# Patient Record
Sex: Male | Born: 1965 | Hispanic: No | Smoking: Never smoker
Health system: Southern US, Community
[De-identification: ages and names within clinical notes are randomized; demographics above are authoritative.]

## PROBLEM LIST (undated history)

## (undated) DIAGNOSIS — E119 Type 2 diabetes mellitus without complications: Secondary | ICD-10-CM

## (undated) DIAGNOSIS — Z992 Dependence on renal dialysis: Secondary | ICD-10-CM

## (undated) DIAGNOSIS — I10 Essential (primary) hypertension: Secondary | ICD-10-CM

## (undated) DIAGNOSIS — T4145XA Adverse effect of unspecified anesthetic, initial encounter: Secondary | ICD-10-CM

## (undated) DIAGNOSIS — N186 End stage renal disease: Secondary | ICD-10-CM

## (undated) DIAGNOSIS — M109 Gout, unspecified: Secondary | ICD-10-CM

## (undated) DIAGNOSIS — E785 Hyperlipidemia, unspecified: Secondary | ICD-10-CM

## (undated) DIAGNOSIS — T8859XA Other complications of anesthesia, initial encounter: Secondary | ICD-10-CM

## (undated) DIAGNOSIS — N289 Disorder of kidney and ureter, unspecified: Secondary | ICD-10-CM

## (undated) DIAGNOSIS — N2581 Secondary hyperparathyroidism of renal origin: Secondary | ICD-10-CM

## (undated) DIAGNOSIS — D649 Anemia, unspecified: Secondary | ICD-10-CM

## (undated) HISTORY — PX: FISTULA PLUG: SHX5831

## (undated) HISTORY — DX: Anemia, unspecified: D64.9

## (undated) HISTORY — DX: Gout, unspecified: M10.9

## (undated) HISTORY — DX: Secondary hyperparathyroidism of renal origin: N25.81

## (undated) HISTORY — PX: NO PAST SURGERIES: SHX2092

---

## 2013-11-23 ENCOUNTER — Encounter: Payer: Self-pay | Admitting: Podiatry

## 2013-11-23 ENCOUNTER — Ambulatory Visit (INDEPENDENT_AMBULATORY_CARE_PROVIDER_SITE_OTHER): Payer: BC Managed Care – PPO | Admitting: Podiatry

## 2013-11-23 VITALS — BP 140/95 | HR 63 | Ht 74.0 in | Wt 177.0 lb

## 2013-11-23 DIAGNOSIS — Q828 Other specified congenital malformations of skin: Secondary | ICD-10-CM

## 2013-11-23 DIAGNOSIS — E1149 Type 2 diabetes mellitus with other diabetic neurological complication: Secondary | ICD-10-CM

## 2013-11-23 DIAGNOSIS — E1142 Type 2 diabetes mellitus with diabetic polyneuropathy: Secondary | ICD-10-CM

## 2013-11-23 DIAGNOSIS — M722 Plantar fascial fibromatosis: Secondary | ICD-10-CM | POA: Insufficient documentation

## 2013-11-23 DIAGNOSIS — L84 Corns and callosities: Secondary | ICD-10-CM

## 2013-11-23 DIAGNOSIS — E1140 Type 2 diabetes mellitus with diabetic neuropathy, unspecified: Secondary | ICD-10-CM

## 2013-11-23 NOTE — Progress Notes (Addendum)
Subjective: 48 year old NIDDM presents with pain and burning on both feet.  Stated that his blood sugar stay at 150 level before he takes morning pills.   Objective: Venous stasis discoloration dermatitis bilateral ankles without open skin. Pedal pulses all palpable.  Decreased tactile sensations, decreased sensory response to Monofilament testing both feet. Multiple hammer toes bilateral. High arched foot with loss of plantar fat pad.  Plantar calluses under 2-4 MPJ bilateral. Left heel pain with loss of fat pad.  Assessment: NIDDM uncontrolled. Diabetic neuropathy. Plantar callus bilateral. Heel pain left secondary to loss of plantar fat pad.    Plan: 1. Discussed diabetic foot care and importance of keeping blood sugar under control. Patient will go have nutritional consultation for diabetic diet.  2.  Debrided all calluses bilateral. Patient was encouraged to return to have calluses trimmed in regular basis. 3. Reviewed Neuremedy and PDC Restorer for Neuropathic pain bilateral. 4.  Discussed benefit from orthotic shoe inserts. Patient will return to have orthotics made.

## 2013-11-23 NOTE — Patient Instructions (Signed)
Seen for pain on both feet.  Debrided all calluses bilateral. Reviewed Neuremedy and PDC Restorer for Neuropathic pain bilateral. May benefit from orthotic shoe inserts. Will contact insurance co for benefits.

## 2013-12-13 ENCOUNTER — Ambulatory Visit (INDEPENDENT_AMBULATORY_CARE_PROVIDER_SITE_OTHER): Payer: BC Managed Care – PPO | Admitting: Podiatry

## 2013-12-13 ENCOUNTER — Encounter: Payer: Self-pay | Admitting: Podiatry

## 2013-12-13 VITALS — BP 168/98 | HR 64 | Ht 74.0 in | Wt 177.0 lb

## 2013-12-13 DIAGNOSIS — E1140 Type 2 diabetes mellitus with diabetic neuropathy, unspecified: Secondary | ICD-10-CM

## 2013-12-13 DIAGNOSIS — E1142 Type 2 diabetes mellitus with diabetic polyneuropathy: Secondary | ICD-10-CM

## 2013-12-13 DIAGNOSIS — M722 Plantar fascial fibromatosis: Secondary | ICD-10-CM

## 2013-12-13 DIAGNOSIS — E1149 Type 2 diabetes mellitus with other diabetic neurological complication: Secondary | ICD-10-CM

## 2013-12-13 DIAGNOSIS — Q828 Other specified congenital malformations of skin: Secondary | ICD-10-CM

## 2013-12-13 DIAGNOSIS — M79609 Pain in unspecified limb: Secondary | ICD-10-CM

## 2013-12-13 NOTE — Progress Notes (Signed)
Patient presents to have orthotics prepared.  Both feet casted for Orthotics. X-rays taken and noted of long 2nd Metatarsal bone with high arched foot.  No acute changes in osseous or articular surfaces noted. Reviewed findings with patient.

## 2013-12-13 NOTE — Patient Instructions (Signed)
Both feet casted for orthotics and X-rays taken.

## 2014-01-21 ENCOUNTER — Ambulatory Visit: Payer: BC Managed Care – PPO | Admitting: Podiatry

## 2014-01-21 ENCOUNTER — Ambulatory Visit (INDEPENDENT_AMBULATORY_CARE_PROVIDER_SITE_OTHER): Payer: BC Managed Care – PPO | Admitting: Podiatry

## 2014-01-21 ENCOUNTER — Encounter: Payer: Self-pay | Admitting: Podiatry

## 2014-01-21 VITALS — BP 161/104 | HR 75

## 2014-01-21 DIAGNOSIS — M722 Plantar fascial fibromatosis: Secondary | ICD-10-CM

## 2014-01-21 DIAGNOSIS — E1149 Type 2 diabetes mellitus with other diabetic neurological complication: Secondary | ICD-10-CM

## 2014-01-21 DIAGNOSIS — M79609 Pain in unspecified limb: Secondary | ICD-10-CM

## 2014-01-21 DIAGNOSIS — Q828 Other specified congenital malformations of skin: Secondary | ICD-10-CM

## 2014-01-21 DIAGNOSIS — M79606 Pain in leg, unspecified: Secondary | ICD-10-CM

## 2014-01-21 DIAGNOSIS — E1140 Type 2 diabetes mellitus with diabetic neuropathy, unspecified: Secondary | ICD-10-CM

## 2014-01-21 DIAGNOSIS — E1142 Type 2 diabetes mellitus with diabetic polyneuropathy: Secondary | ICD-10-CM

## 2014-01-21 NOTE — Progress Notes (Signed)
Seen for one month follow up. Orthotics are doing well.  Takes a few minutes to get used to and then the feet are feeling fine.  Objective: Thick recurrent callus plantar surface both feet. High arched foot with loss of fat pad on plantar surfaces including heel.  Multiple brown colored skin in patches with poor integrity both lower limbs. Loss of normal tactile sensation on both feet.  Assessment: Diabetic neuropathy with painful calluses bilateral.  Plan: Reviewed findings. All calluses debrided. Return in 2 month to repeat debridement.

## 2014-01-21 NOTE — Patient Instructions (Signed)
Doing well with Orthotics. All calluses trimmed today. Return in 2 months or sooner if needed.

## 2014-05-19 ENCOUNTER — Inpatient Hospital Stay (HOSPITAL_BASED_OUTPATIENT_CLINIC_OR_DEPARTMENT_OTHER)
Admission: EM | Admit: 2014-05-19 | Discharge: 2014-05-21 | DRG: 683 | Disposition: A | Discharge status: Home / Self Care | Payer: BC Managed Care – PPO | Attending: Internal Medicine | Admitting: Internal Medicine

## 2014-05-19 ENCOUNTER — Emergency Department (HOSPITAL_BASED_OUTPATIENT_CLINIC_OR_DEPARTMENT_OTHER): Payer: BC Managed Care – PPO

## 2014-05-19 ENCOUNTER — Encounter (HOSPITAL_BASED_OUTPATIENT_CLINIC_OR_DEPARTMENT_OTHER): Payer: Self-pay | Admitting: Emergency Medicine

## 2014-05-19 DIAGNOSIS — N19 Unspecified kidney failure: Secondary | ICD-10-CM

## 2014-05-19 DIAGNOSIS — Q612 Polycystic kidney, adult type: Secondary | ICD-10-CM

## 2014-05-19 DIAGNOSIS — M722 Plantar fascial fibromatosis: Secondary | ICD-10-CM

## 2014-05-19 DIAGNOSIS — N179 Acute kidney failure, unspecified: Principal | ICD-10-CM

## 2014-05-19 DIAGNOSIS — E1169 Type 2 diabetes mellitus with other specified complication: Secondary | ICD-10-CM | POA: Diagnosis present

## 2014-05-19 DIAGNOSIS — E872 Acidosis, unspecified: Secondary | ICD-10-CM | POA: Diagnosis present

## 2014-05-19 DIAGNOSIS — E785 Hyperlipidemia, unspecified: Secondary | ICD-10-CM | POA: Diagnosis present

## 2014-05-19 DIAGNOSIS — Q828 Other specified congenital malformations of skin: Secondary | ICD-10-CM

## 2014-05-19 DIAGNOSIS — I10 Essential (primary) hypertension: Secondary | ICD-10-CM

## 2014-05-19 DIAGNOSIS — D509 Iron deficiency anemia, unspecified: Secondary | ICD-10-CM | POA: Diagnosis present

## 2014-05-19 DIAGNOSIS — L84 Corns and callosities: Secondary | ICD-10-CM

## 2014-05-19 DIAGNOSIS — Z79899 Other long term (current) drug therapy: Secondary | ICD-10-CM

## 2014-05-19 DIAGNOSIS — D649 Anemia, unspecified: Secondary | ICD-10-CM | POA: Diagnosis present

## 2014-05-19 DIAGNOSIS — M109 Gout, unspecified: Secondary | ICD-10-CM

## 2014-05-19 DIAGNOSIS — Z7982 Long term (current) use of aspirin: Secondary | ICD-10-CM

## 2014-05-19 DIAGNOSIS — R600 Localized edema: Secondary | ICD-10-CM

## 2014-05-19 DIAGNOSIS — Z880 Allergy status to penicillin: Secondary | ICD-10-CM

## 2014-05-19 DIAGNOSIS — E119 Type 2 diabetes mellitus without complications: Secondary | ICD-10-CM

## 2014-05-19 DIAGNOSIS — G5790 Unspecified mononeuropathy of unspecified lower limb: Secondary | ICD-10-CM | POA: Diagnosis present

## 2014-05-19 DIAGNOSIS — E875 Hyperkalemia: Secondary | ICD-10-CM | POA: Diagnosis present

## 2014-05-19 DIAGNOSIS — Z841 Family history of disorders of kidney and ureter: Secondary | ICD-10-CM

## 2014-05-19 DIAGNOSIS — E1122 Type 2 diabetes mellitus with diabetic chronic kidney disease: Secondary | ICD-10-CM

## 2014-05-19 DIAGNOSIS — D6489 Other specified anemias: Secondary | ICD-10-CM

## 2014-05-19 DIAGNOSIS — E1140 Type 2 diabetes mellitus with diabetic neuropathy, unspecified: Secondary | ICD-10-CM | POA: Diagnosis present

## 2014-05-19 HISTORY — DX: Type 2 diabetes mellitus without complications: E11.9

## 2014-05-19 HISTORY — DX: Disorder of kidney and ureter, unspecified: N28.9

## 2014-05-19 HISTORY — DX: Essential (primary) hypertension: I10

## 2014-05-19 HISTORY — DX: Hyperlipidemia, unspecified: E78.5

## 2014-05-19 LAB — URINALYSIS, ROUTINE W REFLEX MICROSCOPIC
BILIRUBIN URINE: NEGATIVE
GLUCOSE, UA: NEGATIVE mg/dL
KETONES UR: NEGATIVE mg/dL
Leukocytes, UA: NEGATIVE
Nitrite: NEGATIVE
PH: 6 (ref 5.0–8.0)
PROTEIN: 100 mg/dL — AB
Specific Gravity, Urine: 1.01 (ref 1.005–1.030)
Urobilinogen, UA: 0.2 mg/dL (ref 0.0–1.0)

## 2014-05-19 LAB — CBC
HCT: 35.9 % — ABNORMAL LOW (ref 39.0–52.0)
Hemoglobin: 12 g/dL — ABNORMAL LOW (ref 13.0–17.0)
MCH: 25.2 pg — ABNORMAL LOW (ref 26.0–34.0)
MCHC: 33.4 g/dL (ref 30.0–36.0)
MCV: 75.3 fL — AB (ref 78.0–100.0)
PLATELETS: 187 10*3/uL (ref 150–400)
RBC: 4.77 MIL/uL (ref 4.22–5.81)
RDW: 13.5 % (ref 11.5–15.5)
WBC: 9.6 10*3/uL (ref 4.0–10.5)

## 2014-05-19 LAB — COMPREHENSIVE METABOLIC PANEL
ALBUMIN: 4.4 g/dL (ref 3.5–5.2)
ALK PHOS: 117 U/L (ref 39–117)
ALT: 16 U/L (ref 0–53)
AST: 15 U/L (ref 0–37)
Anion gap: 18 — ABNORMAL HIGH (ref 5–15)
BILIRUBIN TOTAL: 0.3 mg/dL (ref 0.3–1.2)
BUN: 57 mg/dL — ABNORMAL HIGH (ref 6–23)
CO2: 20 meq/L (ref 19–32)
Calcium: 9.6 mg/dL (ref 8.4–10.5)
Chloride: 102 mEq/L (ref 96–112)
Creatinine, Ser: 4.9 mg/dL — ABNORMAL HIGH (ref 0.50–1.35)
GFR calc Af Amer: 15 mL/min — ABNORMAL LOW (ref >90)
GFR, EST NON AFRICAN AMERICAN: 13 mL/min — AB (ref >90)
Glucose, Bld: 100 mg/dL — ABNORMAL HIGH (ref 70–99)
POTASSIUM: 4.5 meq/L (ref 3.7–5.3)
Sodium: 140 mEq/L (ref 137–147)
Total Protein: 8.8 g/dL — ABNORMAL HIGH (ref 6.0–8.3)

## 2014-05-19 LAB — URINE MICROSCOPIC-ADD ON

## 2014-05-19 NOTE — ED Provider Notes (Addendum)
CSN: [id number]     Arrival date & time 05/19/14  1904 History   First MD Initiated Contact with Patient 05/19/14 1948     Chief Complaint  Patient presents with  . Foot Pain     (Consider location/radiation/quality/duration/timing/severity/associated sxs/prior Treatment) HPI Comments: Pt states that he has had swelling of his left foot for the last couple of weeks and in the last week he has noticed it is his right foot and up his shin in the left leg. Pt denies cp, sob. States that he has also developed redness to the top of the left foot in the last week. States that he came in because he thought more labs needs to be drawn  The history is provided by the patient. No language interpreter was used.    Past Medical History  Diagnosis Date  . Diabetes mellitus without complication   . Hypertension   . Renal disorder    History reviewed. No pertinent past surgical history. No family history on file. History  Substance Use Topics  . Smoking status: Never Smoker   . Smokeless tobacco: Never Used  . Alcohol Use: No    Review of Systems  Constitutional: Negative.   Respiratory: Negative for cough and shortness of breath.   Cardiovascular: Negative for chest pain.  Gastrointestinal: Negative.       Allergies  Penicillins  Home Medications   Prior to Admission medications   Medication Sig Start Date End Date Taking? Authorizing Provider  aspirin 81 MG tablet Take 81 mg by mouth daily.   Yes Historical Provider, MD  atorvastatin (LIPITOR) 40 MG tablet Take 40 mg by mouth daily.   Yes Historical Provider, MD  carvedilol (COREG) 6.25 MG tablet Take 6.25 mg by mouth 2 (two) times daily with a meal.   Yes Historical Provider, MD  nateglinide (STARLIX) 120 MG tablet Take 120 mg by mouth 3 (three) times daily with meals.   Yes Historical Provider, MD  amLODipine (NORVASC) 5 MG tablet Take 10 mg by mouth daily.  11/15/13   Historical Provider, MD  glipiZIDE (GLUCOTROL) 5 MG tablet  Take 5 mg by mouth daily before breakfast.  11/15/13   Historical Provider, MD  isosorbide-hydrALAZINE (BIDIL) 20-37.5 MG per tablet Take 1 tablet by mouth 3 (three) times daily.    Historical Provider, MD  simvastatin (ZOCOR) 20 MG tablet Take 20 mg by mouth daily.  11/15/13   Historical Provider, MD   BP 164/101  Pulse 75  Temp(Src) 97.8 F (36.6 C) (Oral)  Resp 18  Ht 6\' 2"  (1.88 m)  Wt 177 lb (80.287 kg)  BMI 22.72 kg/m2  SpO2 99% Physical Exam  Nursing note and vitals reviewed. Constitutional: He is oriented to person, place, and time. He appears well-developed and well-nourished.  Cardiovascular: Normal rate and regular rhythm.   Pulmonary/Chest: Effort normal and breath sounds normal.  Musculoskeletal: He exhibits edema.  Pitting edema to left shin and foot. And to right foot  Neurological: He is alert and oriented to person, place, and time.  Skin:  Mild redness noted to the top of the left foot. Pt has full rom. Pulses intact    ED Course  Procedures (including critical care time) Labs Review Labs Reviewed  CBC - Abnormal; Notable for the following:    Hemoglobin 12.0 (*)    HCT 35.9 (*)    MCV 75.3 (*)    MCH 25.2 (*)    All other components within normal limits  COMPREHENSIVE METABOLIC  PANEL - Abnormal; Notable for the following:    Glucose, Bld 100 (*)    BUN 57 (*)    Creatinine, Ser 4.90 (*)    Total Protein 8.8 (*)    GFR calc non Af Amer 13 (*)    GFR calc Af Amer 15 (*)    Anion gap 18 (*)    All other components within normal limits  URINALYSIS, ROUTINE W REFLEX MICROSCOPIC - Abnormal; Notable for the following:    Hgb urine dipstick TRACE (*)    Protein, ur 100 (*)    All other components within normal limits  URINE MICROSCOPIC-ADD ON    Imaging Review Dg Chest 2 View  05/19/2014   CLINICAL DATA:  Swelling in left foot for 2 weeks.  EXAM: CHEST  2 VIEW  COMPARISON:  None.  FINDINGS: The heart size and mediastinal contours are within normal  limits. Both lungs are clear. The visualized skeletal structures are unremarkable.  IMPRESSION: No active cardiopulmonary disease.   Electronically Signed   By: Kathreen Devoid   On: 05/19/2014 20:54   Dg Foot Complete Left  05/19/2014   CLINICAL DATA:  Foot swelling  EXAM: LEFT FOOT - COMPLETE 3+ VIEW  COMPARISON:  None.  FINDINGS: There is no evidence of fracture or dislocation. There is no evidence of arthropathy or other focal bone abnormality. Dorsal soft tissue swelling noted. Small plantar heel spur noted.  IMPRESSION: 1. Dorsal soft tissue swelling. 2. No acute bone abnormalities.   Electronically Signed   By: Kerby Moors M.D.   On: 05/19/2014 20:40     EKG Interpretation None      MDM   Final diagnoses:  Renal failure    Pt to be admitted to [facility] for renal failure, will lower extremity edema. Considered a dvt in the left leg ultrasound not available at this time and think unlikely. Pt has no pulmonary edema. Area on redness to foot noted. Don't think antibiotics are need to be started at this time    Glendell Docker, NP 05/19/14 2132  Glendell Docker, NP 05/23/14 [phone]

## 2014-05-19 NOTE — ED Provider Notes (Addendum)
Medical screening examination/treatment/procedure(s) were conducted as a shared visit with non-physician practitioner(s) and myself.  I personally evaluated the patient during the encounter.   EKG Interpretation None      47 yo male with moderate, gradual onset, waxing and waning edema to bilateral lower extremities, worse on left.  On exam, well appearing, nontoxic, not distressed, normal respiratory effort, normal perfusion, 1+ pitting edema LLE, tract pitting edema RLE, 2+ B DP pulses.  Labs show renal insufficiency.  Plan admit.   Clinical Impression: 1. Renal failure   2. Bilateral edema of lower extremity       Houston Siren III, MD 05/19/14 2321  Arbie Cookey, MD 05/19/14 [phone]

## 2014-05-19 NOTE — Progress Notes (Signed)
  Pt admitted to the unit. Pt is stable, alert and oriented per baseline. Oriented to room, staff, and call bell. Educated to call for any assistance. Bed in lowest position, call bell within reach- will continue to monitor. 

## 2014-05-19 NOTE — ED Notes (Signed)
Pt report given to [place], Therapist, sports with [facility].

## 2014-05-19 NOTE — ED Notes (Signed)
Report given to RN Ariealle.

## 2014-05-19 NOTE — ED Notes (Signed)
Pain and swelling to his left ankle on and off x 1 week.

## 2014-05-20 ENCOUNTER — Encounter (HOSPITAL_COMMUNITY): Payer: Self-pay | Admitting: Internal Medicine

## 2014-05-20 ENCOUNTER — Inpatient Hospital Stay (HOSPITAL_COMMUNITY): Payer: BC Managed Care – PPO

## 2014-05-20 DIAGNOSIS — Q612 Polycystic kidney, adult type: Secondary | ICD-10-CM

## 2014-05-20 DIAGNOSIS — N19 Unspecified kidney failure: Secondary | ICD-10-CM

## 2014-05-20 DIAGNOSIS — N179 Acute kidney failure, unspecified: Secondary | ICD-10-CM | POA: Diagnosis present

## 2014-05-20 DIAGNOSIS — I10 Essential (primary) hypertension: Secondary | ICD-10-CM

## 2014-05-20 DIAGNOSIS — R600 Localized edema: Secondary | ICD-10-CM

## 2014-05-20 DIAGNOSIS — D649 Anemia, unspecified: Secondary | ICD-10-CM | POA: Diagnosis present

## 2014-05-20 DIAGNOSIS — M109 Gout, unspecified: Secondary | ICD-10-CM | POA: Diagnosis present

## 2014-05-20 DIAGNOSIS — E119 Type 2 diabetes mellitus without complications: Secondary | ICD-10-CM | POA: Diagnosis present

## 2014-05-20 DIAGNOSIS — R609 Edema, unspecified: Secondary | ICD-10-CM

## 2014-05-20 DIAGNOSIS — D6489 Other specified anemias: Secondary | ICD-10-CM

## 2014-05-20 LAB — CBC WITH DIFFERENTIAL/PLATELET
BASOS PCT: 0 % (ref 0–1)
Basophils Absolute: 0 10*3/uL (ref 0.0–0.1)
EOS PCT: 6 % — AB (ref 0–5)
Eosinophils Absolute: 0.5 10*3/uL (ref 0.0–0.7)
HEMATOCRIT: 32.9 % — AB (ref 39.0–52.0)
HEMOGLOBIN: 10.8 g/dL — AB (ref 13.0–17.0)
LYMPHS ABS: 1.8 10*3/uL (ref 0.7–4.0)
Lymphocytes Relative: 22 % (ref 12–46)
MCH: 24.4 pg — ABNORMAL LOW (ref 26.0–34.0)
MCHC: 32.8 g/dL (ref 30.0–36.0)
MCV: 74.3 fL — AB (ref 78.0–100.0)
MONOS PCT: 9 % (ref 3–12)
Monocytes Absolute: 0.8 10*3/uL (ref 0.1–1.0)
Neutro Abs: 5.3 10*3/uL (ref 1.7–7.7)
Neutrophils Relative %: 63 % (ref 43–77)
PLATELETS: 183 10*3/uL (ref 150–400)
RBC: 4.43 MIL/uL (ref 4.22–5.81)
RDW: 13.3 % (ref 11.5–15.5)
WBC: 8.4 10*3/uL (ref 4.0–10.5)

## 2014-05-20 LAB — RETICULOCYTES
RBC.: 4.43 MIL/uL (ref 4.22–5.81)
RETIC COUNT ABSOLUTE: 48.7 10*3/uL (ref 19.0–186.0)
Retic Ct Pct: 1.1 % (ref 0.4–3.1)

## 2014-05-20 LAB — GLUCOSE, CAPILLARY
Glucose-Capillary: 183 mg/dL — ABNORMAL HIGH (ref 70–99)
Glucose-Capillary: 165 mg/dL — ABNORMAL HIGH (ref 70–99)
Glucose-Capillary: 353 mg/dL — ABNORMAL HIGH (ref 70–99)

## 2014-05-20 LAB — CK: Total CK: 190 U/L (ref 7–232)

## 2014-05-20 LAB — PROTEIN / CREATININE RATIO, URINE
Creatinine, Urine: 58.35 mg/dL
Protein Creatinine Ratio: 0.89 — ABNORMAL HIGH (ref 0.00–0.15)
Total Protein, Urine: 51.8 mg/dL

## 2014-05-20 LAB — URIC ACID: Uric Acid, Serum: 8.7 mg/dL — ABNORMAL HIGH (ref 4.0–7.8)

## 2014-05-20 LAB — COMPREHENSIVE METABOLIC PANEL
ALBUMIN: 3.7 g/dL (ref 3.5–5.2)
ALT: 14 U/L (ref 0–53)
AST: 11 U/L (ref 0–37)
Alkaline Phosphatase: 101 U/L (ref 39–117)
Anion gap: 17 — ABNORMAL HIGH (ref 5–15)
BUN: 54 mg/dL — ABNORMAL HIGH (ref 6–23)
CALCIUM: 8.9 mg/dL (ref 8.4–10.5)
CO2: 19 mEq/L (ref 19–32)
Chloride: 104 mEq/L (ref 96–112)
Creatinine, Ser: 4.71 mg/dL — ABNORMAL HIGH (ref 0.50–1.35)
GFR calc Af Amer: 15 mL/min — ABNORMAL LOW (ref >90)
GFR, EST NON AFRICAN AMERICAN: 13 mL/min — AB (ref >90)
Glucose, Bld: 114 mg/dL — ABNORMAL HIGH (ref 70–99)
Potassium: 4.3 mEq/L (ref 3.7–5.3)
Sodium: 140 mEq/L (ref 137–147)
Total Bilirubin: 0.2 mg/dL — ABNORMAL LOW (ref 0.3–1.2)
Total Protein: 7.5 g/dL (ref 6.0–8.3)

## 2014-05-20 LAB — VITAMIN B12: Vitamin B-12: 479 pg/mL (ref 211–911)

## 2014-05-20 LAB — FERRITIN: FERRITIN: 88 ng/mL (ref 22–322)

## 2014-05-20 LAB — TSH: TSH: 7.05 u[IU]/mL — ABNORMAL HIGH (ref 0.350–4.500)

## 2014-05-20 LAB — IRON AND TIBC
Iron: 45 ug/dL (ref 42–135)
Saturation Ratios: 16 % — ABNORMAL LOW (ref 20–55)
TIBC: 275 ug/dL (ref 215–435)
UIBC: 230 ug/dL (ref 125–400)

## 2014-05-20 LAB — FOLATE: Folate: 11.8 ng/mL

## 2014-05-20 LAB — SODIUM, URINE, RANDOM: Sodium, Ur: 77 mEq/L

## 2014-05-20 LAB — SEDIMENTATION RATE: SED RATE: 46 mm/h — AB (ref 0–16)

## 2014-05-20 LAB — PHOSPHORUS: PHOSPHORUS: 5.1 mg/dL — AB (ref 2.3–4.6)

## 2014-05-20 LAB — CREATININE, URINE, RANDOM: Creatinine, Urine: 55.37 mg/dL

## 2014-05-20 MED ORDER — ATORVASTATIN CALCIUM 40 MG PO TABS
40.0000 mg | ORAL_TABLET | Freq: Every day | ORAL | Status: DC
  Administered 2014-05-20: 40 mg via ORAL
  Filled 2014-05-20 (×2): qty 1

## 2014-05-20 MED ORDER — HEPARIN SODIUM (PORCINE) 5000 UNIT/ML IJ SOLN
5000.0000 [IU] | Freq: Three times a day (TID) | INTRAMUSCULAR | Status: DC
  Filled 2014-05-20 (×7): qty 1

## 2014-05-20 MED ORDER — INSULIN ASPART 100 UNIT/ML ~~LOC~~ SOLN
5.0000 [IU] | Freq: Once | SUBCUTANEOUS | Status: AC
  Administered 2014-05-20: 5 [IU] via SUBCUTANEOUS

## 2014-05-20 MED ORDER — SODIUM CHLORIDE 0.9 % IV SOLN
INTRAVENOUS | Status: AC
  Administered 2014-05-20: 02:00:00 via INTRAVENOUS

## 2014-05-20 MED ORDER — ATORVASTATIN CALCIUM 40 MG PO TABS
40.0000 mg | ORAL_TABLET | Freq: Every day | ORAL | Status: DC
  Filled 2014-05-20: qty 1

## 2014-05-20 MED ORDER — AMLODIPINE BESYLATE 10 MG PO TABS
10.0000 mg | ORAL_TABLET | Freq: Every day | ORAL | Status: DC
  Administered 2014-05-20 – 2014-05-21 (×2): 10 mg via ORAL
  Filled 2014-05-20 (×2): qty 1

## 2014-05-20 MED ORDER — ONDANSETRON HCL 4 MG PO TABS: 4.0000 mg | ORAL_TABLET | Freq: Four times a day (QID) | ORAL | Status: DC | PRN

## 2014-05-20 MED ORDER — ACETAMINOPHEN 650 MG RE SUPP: 650.0000 mg | Freq: Four times a day (QID) | RECTAL | Status: DC | PRN

## 2014-05-20 MED ORDER — CARVEDILOL 6.25 MG PO TABS
6.2500 mg | ORAL_TABLET | Freq: Two times a day (BID) | ORAL | Status: DC
  Administered 2014-05-20 – 2014-05-21 (×3): 6.25 mg via ORAL
  Filled 2014-05-20 (×5): qty 1

## 2014-05-20 MED ORDER — PREDNISONE 20 MG PO TABS
40.0000 mg | ORAL_TABLET | Freq: Every day | ORAL | Status: DC
  Administered 2014-05-20: 40 mg via ORAL
  Filled 2014-05-20 (×4): qty 2

## 2014-05-20 MED ORDER — ONDANSETRON HCL 4 MG/2ML IJ SOLN: 4.0000 mg | Freq: Four times a day (QID) | INTRAMUSCULAR | Status: DC | PRN

## 2014-05-20 MED ORDER — INSULIN ASPART 100 UNIT/ML ~~LOC~~ SOLN
0.0000 [IU] | Freq: Three times a day (TID) | SUBCUTANEOUS | Status: DC
  Administered 2014-05-20: 1 [IU] via SUBCUTANEOUS
  Administered 2014-05-20 (×2): 2 [IU] via SUBCUTANEOUS

## 2014-05-20 MED ORDER — ACETAMINOPHEN 325 MG PO TABS: 650.0000 mg | ORAL_TABLET | Freq: Four times a day (QID) | ORAL | Status: DC | PRN

## 2014-05-20 NOTE — ED Provider Notes (Signed)
Medical screening examination/treatment/procedure(s) were conducted as a shared visit with non-physician practitioner(s) and myself.  I personally evaluated the patient during the encounter.   EKG Interpretation None        Houston Siren III, MD 05/20/14 [phone]

## 2014-05-20 NOTE — H&P (Signed)
[facility] History and Physical  Donald Alvarez [id number] DOB: 08-13-1966 DOA: 05/19/2014  Referring physician: ER physician. Patient was transferred from [facility]. PCP: [name], MD   Chief Complaint: Lower extremity edema and renal failure.  HPI: Donald Alvarez is a 48 y.o. male with history of hypertension diabetes mellitus hyperlipidemia experiencing increasing pain in the left ankle with swelling over the last one week. Patient states that he was diagnosed with renal failure 3 weeks ago and was referred to nephrologist Dr. Clover Mealy. Patient does not recall his creatinine exactly but states it may be around 3. In the ER was found to be around 4.9. On exam patient was having mild edema of the lower extremities more on the left side. In addition patient was having pain on moving his left ankle but not his right ankle. Denies any fever chills nausea vomiting abdominal pain diarrhea chest pain or shortness of breath or any productive cough. Patient is not on any ACE inhibitors or ARB or diuretics. Patient states he has taken Motrin 3 days ago for low back pain which is chronic. He states he takes NSAIDs very rarely and at the most once a month. UA shows mild protein but no definite casts. Patient has been admit for further management. On exam patient has mild tenderness of the left ankle.   Review of Systems: As presented in the history of presenting illness, rest negative.  Past Medical History  Diagnosis Date  . Diabetes mellitus without complication   . Hypertension   . Renal disorder   . HLD (hyperlipidemia)    Past Surgical History  Procedure Laterality Date  . No past surgeries     Social History:  reports that he has never smoked. He has never used smokeless tobacco. He reports that he does not drink alcohol or use illicit drugs. Where does patient live home. Can patient participate in ADLs? Yes.  Allergies  Allergen Reactions  . Penicillins      Family History:  Family History  Problem Relation Age of Onset  . Diabetes Mellitus II Mother   . Kidney disease Mother   . CAD Father       Prior to Admission medications   Medication Sig Start Date End Date Taking? Authorizing Provider  aspirin 81 MG tablet Take 81 mg by mouth daily.   Yes Historical Provider, MD  atorvastatin (LIPITOR) 40 MG tablet Take 40 mg by mouth daily.   Yes Historical Provider, MD  carvedilol (COREG) 6.25 MG tablet Take 6.25 mg by mouth 2 (two) times daily with a meal.   Yes Historical Provider, MD  nateglinide (STARLIX) 120 MG tablet Take 120 mg by mouth 3 (three) times daily with meals.   Yes Historical Provider, MD  amLODipine (NORVASC) 5 MG tablet Take 10 mg by mouth daily.  11/15/13   Historical Provider, MD  glipiZIDE (GLUCOTROL) 5 MG tablet Take 5 mg by mouth daily before breakfast.  11/15/13   Historical Provider, MD  isosorbide-hydrALAZINE (BIDIL) 20-37.5 MG per tablet Take 1 tablet by mouth 3 (three) times daily.    Historical Provider, MD  simvastatin (ZOCOR) 20 MG tablet Take 20 mg by mouth daily.  11/15/13   Historical Provider, MD    Physical Exam: Filed Vitals:   05/19/14 1915 05/19/14 2232 05/19/14 2337  BP: 164/101 160/92 167/88  Pulse: 75 75 76  Temp: 97.8 F (36.6 C) 97.7 F (36.5 C) 97.9 F (36.6 C)  TempSrc: Oral Oral Oral  Resp: 18 16 16  Height: 6\' 2"  (1.88 m)  6\' 2"  (1.88 m)  Weight: 80.287 kg (177 lb)  84.369 kg (186 lb)  SpO2: 99% 99% 98%     General:  Well-developed well-nourished.  Eyes: Anicteric no pallor.  ENT: No discharge from the ears eyes nose mouth.  Neck: No mass felt. JVD not appreciated.  Cardiovascular: S1-S2 heard.  Respiratory: No rhonchi or crepitations.  Abdomen: Soft nontender bowel sounds present. No guarding or rigidity.  Skin: No rash.  Musculoskeletal: Mild tenderness in the left ankle with bilateral edema of the lower extremities more on the left side. The left lower extremity edema  extends up to the calf.  Psychiatric: Appears normal.  Neurologic: Alert awake oriented to time place and person. Moves all extremities.  Labs on Admission:  Basic Metabolic Panel:  Recent Labs Lab 05/19/14 2004  NA 140  K 4.5  CL 102  CO2 20  GLUCOSE 100*  BUN 57*  CREATININE 4.90*  CALCIUM 9.6   Liver Function Tests:  Recent Labs Lab 05/19/14 2004  AST 15  ALT 16  ALKPHOS 117  BILITOT 0.3  PROT 8.8*  ALBUMIN 4.4   No results found for this basename: LIPASE, AMYLASE,  in the last 168 hours No results found for this basename: AMMONIA,  in the last 168 hours CBC:  Recent Labs Lab 05/19/14 2004  WBC 9.6  HGB 12.0*  HCT 35.9*  MCV 75.3*  PLT 187   Cardiac Enzymes: No results found for this basename: CKTOTAL, CKMB, CKMBINDEX, TROPONINI,  in the last 168 hours  BNP (last 3 results) No results found for this basename: PROBNP,  in the last 8760 hours CBG: No results found for this basename: GLUCAP,  in the last 168 hours  Radiological Exams on Admission: Dg Chest 2 View  05/19/2014   CLINICAL DATA:  Swelling in left foot for 2 weeks.  EXAM: CHEST  2 VIEW  COMPARISON:  None.  FINDINGS: The heart size and mediastinal contours are within normal limits. Both lungs are clear. The visualized skeletal structures are unremarkable.  IMPRESSION: No active cardiopulmonary disease.   Electronically Signed   By: Kathreen Devoid   On: 05/19/2014 20:54   Dg Foot Complete Left  05/19/2014   CLINICAL DATA:  Foot swelling  EXAM: LEFT FOOT - COMPLETE 3+ VIEW  COMPARISON:  None.  FINDINGS: There is no evidence of fracture or dislocation. There is no evidence of arthropathy or other focal bone abnormality. Dorsal soft tissue swelling noted. Small plantar heel spur noted.  IMPRESSION: 1. Dorsal soft tissue swelling. 2. No acute bone abnormalities.   Electronically Signed   By: Kerby Moors M.D.   On: 05/19/2014 20:40    Assessment/Plan Principal Problem:   Renal failure Active  Problems:   Bilateral edema of lower extremity   HTN (hypertension)   Diabetes mellitus   Anemia   Acute renal failure   1. Renal failure probably acute on chronic - I have ordered renal sonogram, urine sodium creatinine and protein creatinine ratio. Check SPEP and UPEP. For now and gently hydrating patient(patient does have mild edema in the lower extremity but it is also tender on the left side). Closely follow intake output and metabolic panel and daily weight and consult nephrologist in a.m. 2. Lower extremity edema with tenderness in the left ankle - check Dopplers to rule out DVT and check sedimentation rate and uric acid levels. 3. Anemia - I have advised patient eventually will need colonoscopy. Check anemia  panel. Check SPEP. 4. Hypertension - continue home medications. 5. Diabetes mellitus - holding off oral antihyperglycemic for now. Place patient on sliding-scale coverage. Check hemoglobin A1c. 6. Hyperlipidemia - check CK levels. On statins.    Code Status: Full code.  Family Communication: None.  Disposition Plan: Admit to inpatient.    Tabbitha Janvrin N. [facility] Pager [phone].  If 7PM-7AM, please contact night-coverage [email or web address] Password [email or web address] 05/20/2014, 1:41 AM

## 2014-05-20 NOTE — Consult Note (Signed)
Reason for Consult:AKI on CKD Referring Physician: [name], MD   HPI: Donald Alvarez is a 48 y.o. male with a PMH significant for HTN (difficult to control), DMII (diagnosed ~4 yrs ago, unknown HA1c), dyslipidemia who p/w increasing pain and swelling in the left leg over the past week which is new.  He describes the pain as "burning" without any erythema or warmth.  No history of gout.  He was told he had "problems with his kidneys" over a year ago by his PCP.  Last visit with his PCP Donald Alvarez, [place]) was a month ago with a Cr of 3 with referral to Dr. [name] but was unable to get an appointment for two months.  Denies any fever/chills, CP, SOB, N/V, abdominal pain, urinary symptoms (no hematuria, dysuria, hesitancy), no hematochezia, melena.  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  No recent long-distance travel.  Pt not on any ACEi/ARB.  Took 451m ibuprofen last week for pain in his foot.  Denies regular use of NSAIDS.  No recent abx, IV contrast.  No new medications, except for neclinide started two months ago.  He has been eating and drinking normally.  No h/o tobacco abuse, recreational drug use, no regular alcohol use.  Family history is significant for his mother ((73yo) who had a renal transplant 3 yrs ago for "cysts on top of her kidney."  She was briefly on dialysis while awaiting transplant.  She is going well and lives in [place]  Has two brothers and is unsure if they have any kidney issues.  No known hepatic issues.       PMH:   Past Medical History  Diagnosis Date  . Diabetes mellitus without complication   . Hypertension   . Renal disorder   . HLD (hyperlipidemia)     PSH:   Past Surgical History  Procedure Laterality Date  . No past surgeries      Allergies:  Allergies  Allergen Reactions  . Penicillins Hives    Medications:   Prior to Admission medications   Medication Sig Start Date  End Date Taking? Authorizing Provider  aspirin 81 MG tablet Take 81 mg by mouth daily.   Yes Historical Provider, MD  atorvastatin (LIPITOR) 40 MG tablet Take 40 mg by mouth daily.   Yes Historical Provider, MD  carvedilol (COREG) 6.25 MG tablet Take 6.25 mg by mouth 2 (two) times daily with a meal.   Yes Historical Provider, MD  Cholecalciferol (VITAMIN D3) 3000 UNITS TABS Take 1 tablet by mouth daily.   Yes Historical Provider, MD  insulin glargine (LANTUS) 100 UNIT/ML injection Inject 10 Units into the skin daily as needed (for high blood sugar).   Yes Historical Provider, MD  nateglinide (STARLIX) 120 MG tablet Take 120 mg by mouth 3 (three) times daily with meals.   Yes Historical Provider, MD  amLODipine (NORVASC) 5 MG tablet Take 10 mg by mouth daily.  11/15/13   Historical Provider, MD    Discontinued Meds:   Medications Discontinued During This Encounter  Medication Reason  . glipiZIDE (GLUCOTROL) 5 MG tablet Discontinued by provider  . isosorbide-hydrALAZINE (BIDIL) 20-37.5 MG per tablet Side effect (s)  . simvastatin (ZOCOR) 20 MG tablet Change in therapy    Social History:  reports that he has never smoked. He has never used smokeless tobacco. He reports that he does not drink alcohol or use illicit drugs.  Family History:  Family History  Problem Relation Age of Onset  . Diabetes Mellitus II Mother   . Kidney disease Mother   . CAD Father    Review of systems: Noted in HPI.   Creatinine, Ser  Date/Time Value Ref Range Status  05/20/2014  3:47 AM 4.71* 0.50 - 1.35 mg/dL Final  05/19/2014  8:04 PM 4.90* 0.50 - 1.35 mg/dL Final    Recent Labs Lab 05/19/14 2004 05/20/14 0347  NA 140 140  K 4.5 4.3  CL 102 104  CO2 20 19  GLUCOSE 100* 114*  BUN 57* 54*  CREATININE 4.90* 4.71*  CALCIUM 9.6 8.9  PHOS  --  5.1*    Recent Labs Lab 05/19/14 2004 05/20/14 0347  WBC 9.6 8.4  NEUTROABS  --  5.3  HGB 12.0* 10.8*  HCT 35.9* 32.9*  MCV 75.3* 74.3*  PLT 187 183    Liver Function Tests:  Recent Labs Lab 05/19/14 2004 05/20/14 0347  AST 15 11  ALT 16 14  ALKPHOS 117 101  BILITOT 0.3 0.2*  PROT 8.8* 7.5  ALBUMIN 4.4 3.7   No results found for this basename: LIPASE, AMYLASE,  in the last 168 hours No results found for this basename: AMMONIA,  in the last 168 hours Cardiac Enzymes:  Recent Labs Lab 05/20/14 0347  CKTOTAL 190   Iron Studies:   Recent Labs  05/20/14 0347  IRON 45  TIBC 275  FERRITIN 88    Results for orders placed during the hospital encounter of 05/19/14 (from the past 48 hour(s))  CBC     Status: Abnormal   Collection Time    05/19/14  8:04 PM      Result Value Ref Range   WBC 9.6  4.0 - 10.5 K/uL   RBC 4.77  4.22 - 5.81 MIL/uL   Hemoglobin 12.0 (*) 13.0 - 17.0 g/dL   HCT 35.9 (*) 39.0 - 52.0 %   MCV 75.3 (*) 78.0 - 100.0 fL   MCH 25.2 (*) 26.0 - 34.0 pg   MCHC 33.4  30.0 - 36.0 g/dL   RDW 13.5  11.5 - 15.5 %   Platelets 187  150 - 400 K/uL  COMPREHENSIVE METABOLIC PANEL     Status: Abnormal   Collection Time    05/19/14  8:04 PM      Result Value Ref Range   Sodium 140  137 - 147 mEq/L   Potassium 4.5  3.7 - 5.3 mEq/L   Chloride 102  96 - 112 mEq/L   CO2 20  19 - 32 mEq/L   Glucose, Bld 100 (*) 70 - 99 mg/dL   BUN 57 (*) 6 - 23 mg/dL   Creatinine, Ser 4.90 (*) 0.50 - 1.35 mg/dL   Calcium 9.6  8.4 - 10.5 mg/dL   Total Protein 8.8 (*) 6.0 - 8.3 g/dL   Albumin 4.4  3.5 - 5.2 g/dL   AST 15  0 - 37 U/L   ALT 16  0 - 53 U/L   Alkaline Phosphatase 117  39 - 117 U/L   Total Bilirubin 0.3  0.3 - 1.2 mg/dL   GFR calc non Af Amer 13 (*) >90 mL/min   GFR calc Af Amer 15 (*) >90 mL/min   Comment: (NOTE)     The eGFR has been calculated using the CKD EPI equation.     This calculation has not been validated in all clinical situations.     eGFR's persistently <90 mL/min signify possible Chronic  Kidney     Disease.   Anion gap 18 (*) 5 - 15  URINALYSIS, ROUTINE W REFLEX MICROSCOPIC     Status: Abnormal    Collection Time    05/19/14  8:08 PM      Result Value Ref Range   Color, Urine YELLOW  YELLOW   APPearance CLEAR  CLEAR   Specific Gravity, Urine 1.010  1.005 - 1.030   pH 6.0  5.0 - 8.0   Glucose, UA NEGATIVE  NEGATIVE mg/dL   Hgb urine dipstick TRACE (*) NEGATIVE   Bilirubin Urine NEGATIVE  NEGATIVE   Ketones, ur NEGATIVE  NEGATIVE mg/dL   Protein, ur 100 (*) NEGATIVE mg/dL   Urobilinogen, UA 0.2  0.0 - 1.0 mg/dL   Nitrite NEGATIVE  NEGATIVE   Leukocytes, UA NEGATIVE  NEGATIVE  URINE MICROSCOPIC-ADD ON     Status: None   Collection Time    05/19/14  8:08 PM      Result Value Ref Range   Squamous Epithelial / LPF RARE  RARE   RBC / HPF 0-2  <3 RBC/hpf   Bacteria, UA RARE  RARE  SODIUM, URINE, RANDOM     Status: None   Collection Time    05/20/14  2:35 AM      Result Value Ref Range   Sodium, Ur 77    CREATININE, URINE, RANDOM     Status: None   Collection Time    05/20/14  2:35 AM      Result Value Ref Range   Creatinine, Urine 55.37    PROTEIN / CREATININE RATIO, URINE     Status: Abnormal   Collection Time    05/20/14  2:35 AM      Result Value Ref Range   Creatinine, Urine 58.35     Total Protein, Urine 51.8     Comment: NO NORMAL RANGE ESTABLISHED FOR THIS TEST   PROTEIN CREATININE RATIO 0.89 (*) 0.00 - 0.15  VITAMIN B12     Status: None   Collection Time    05/20/14  3:47 AM      Result Value Ref Range   Vitamin B-12 479  211 - 911 pg/mL   Comment: Performed at [facility]     Status: None   Collection Time    05/20/14  3:47 AM      Result Value Ref Range   Folate 11.8     Comment: (NOTE)     Reference Ranges            Deficient:       0.4 - 3.3 ng/mL            Indeterminate:   3.4 - 5.4 ng/mL            Normal:              > 5.4 ng/mL     Performed at [facility] TIBC     Status: Abnormal   Collection Time    05/20/14  3:47 AM      Result Value Ref Range   Iron 45  42 - 135 ug/dL   TIBC 275  215 - 435  ug/dL   Saturation Ratios 16 (*) 20 - 55 %   UIBC 230  125 - 400 ug/dL   Comment: Performed at [place] [facility]     Status: None   Collection Time    05/20/14  3:47 AM  Result Value Ref Range   Ferritin 88  22 - 322 ng/mL   Comment: Performed at [facility]     Status: None   Collection Time    05/20/14  3:47 AM      Result Value Ref Range   Retic Ct Pct 1.1  0.4 - 3.1 %   RBC. 4.43  4.22 - 5.81 MIL/uL   Retic Count, Manual 48.7  19.0 - 186.0 K/uL  COMPREHENSIVE METABOLIC PANEL     Status: Abnormal   Collection Time    05/20/14  3:47 AM      Result Value Ref Range   Sodium 140  137 - 147 mEq/L   Potassium 4.3  3.7 - 5.3 mEq/L   Chloride 104  96 - 112 mEq/L   CO2 19  19 - 32 mEq/L   Glucose, Bld 114 (*) 70 - 99 mg/dL   BUN 54 (*) 6 - 23 mg/dL   Creatinine, Ser 4.71 (*) 0.50 - 1.35 mg/dL   Calcium 8.9  8.4 - 10.5 mg/dL   Total Protein 7.5  6.0 - 8.3 g/dL   Albumin 3.7  3.5 - 5.2 g/dL   AST 11  0 - 37 U/L   ALT 14  0 - 53 U/L   Alkaline Phosphatase 101  39 - 117 U/L   Total Bilirubin 0.2 (*) 0.3 - 1.2 mg/dL   GFR calc non Af Amer 13 (*) >90 mL/min   GFR calc Af Amer 15 (*) >90 mL/min   Comment: (NOTE)     The eGFR has been calculated using the CKD EPI equation.     This calculation has not been validated in all clinical situations.     eGFR's persistently <90 mL/min signify possible Chronic Kidney     Disease.   Anion gap 17 (*) 5 - 15  CBC WITH DIFFERENTIAL     Status: Abnormal   Collection Time    05/20/14  3:47 AM      Result Value Ref Range   WBC 8.4  4.0 - 10.5 K/uL   RBC 4.43  4.22 - 5.81 MIL/uL   Hemoglobin 10.8 (*) 13.0 - 17.0 g/dL   HCT 32.9 (*) 39.0 - 52.0 %   MCV 74.3 (*) 78.0 - 100.0 fL   MCH 24.4 (*) 26.0 - 34.0 pg   MCHC 32.8  30.0 - 36.0 g/dL   RDW 13.3  11.5 - 15.5 %   Platelets 183  150 - 400 K/uL   Neutrophils Relative % 63  43 - 77 %   Lymphocytes Relative 22  12 - 46 %   Monocytes Relative 9  3 - 12 %    Eosinophils Relative 6 (*) 0 - 5 %   Basophils Relative 0  0 - 1 %   Neutro Abs 5.3  1.7 - 7.7 K/uL   Lymphs Abs 1.8  0.7 - 4.0 K/uL   Monocytes Absolute 0.8  0.1 - 1.0 K/uL   Eosinophils Absolute 0.5  0.0 - 0.7 K/uL   Basophils Absolute 0.0  0.0 - 0.1 K/uL   Smear Review MORPHOLOGY UNREMARKABLE    TSH     Status: Abnormal   Collection Time    05/20/14  3:47 AM      Result Value Ref Range   TSH 7.050 (*) 0.350 - 4.500 uIU/mL  URIC ACID     Status: Abnormal   Collection Time    05/20/14  3:47 AM  Result Value Ref Range   Uric Acid, Serum 8.7 (*) 4.0 - 7.8 mg/dL  SEDIMENTATION RATE     Status: Abnormal   Collection Time    05/20/14  3:47 AM      Result Value Ref Range   Sed Rate 46 (*) 0 - 16 mm/hr  PHOSPHORUS     Status: Abnormal   Collection Time    05/20/14  3:47 AM      Result Value Ref Range   Phosphorus 5.1 (*) 2.3 - 4.6 mg/dL  CK     Status: None   Collection Time    05/20/14  3:47 AM      Result Value Ref Range   Total CK 190  7 - 232 U/L  GLUCOSE, CAPILLARY     Status: Abnormal   Collection Time    05/20/14 12:06 PM      Result Value Ref Range   Glucose-Capillary 183 (*) 70 - 99 mg/dL    Dg Chest 2 View  05/19/2014   CLINICAL DATA:  Swelling in left foot for 2 weeks.  EXAM: CHEST  2 VIEW  COMPARISON:  None.  FINDINGS: The heart size and mediastinal contours are within normal limits. Both lungs are clear. The visualized skeletal structures are unremarkable.  IMPRESSION: No active cardiopulmonary disease.   Electronically Signed   By: Kathreen Devoid   On: 05/19/2014 20:54   US Renal  05/20/2014   CLINICAL DATA:  Elevated creatinine  EXAM: RENAL/URINARY TRACT ULTRASOUND COMPLETE  COMPARISON:  None.  FINDINGS: Right Kidney:  Length: 16.2 cm. Multiple cysts of varying sizes are noted throughout the right kidney consistent with polyp cystic kidney disease. Mild fullness of the renal pelvis is noted.  Left Kidney:  Length: 16 cm. Multiple cysts of varying sizes are  noted throughout the left kidney. Mild pelvocaliectasis is noted.  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  Mild fullness of the renal pelvis bilaterally. This does not extend into the proximal ureters.   Electronically Signed   By: Inez Catalina M.D.   On: 05/20/2014 07:23   Dg Foot Complete Left  05/19/2014   CLINICAL DATA:  Foot swelling  EXAM: LEFT FOOT - COMPLETE 3+ VIEW  COMPARISON:  None.  FINDINGS: There is no evidence of fracture or dislocation. There is no evidence of arthropathy or other focal bone abnormality. Dorsal soft tissue swelling noted. Small plantar heel spur noted.  IMPRESSION: 1. Dorsal soft tissue swelling. 2. No acute bone abnormalities.   Electronically Signed   By: Kerby Moors M.D.   On: 05/19/2014 20:40    Vital signs: Blood pressure 127/80, pulse 73, temperature 98 F (36.7 C), temperature source Oral, resp. rate 16, height _0  (1.88 m), weight 186 lb (84.369 kg), SpO2 98.00%.  Physical Exam: Constitutional: Vital signs reviewed.  Patient is a well-developed and well-nourished male in no acute distress and cooperative with exam.  Head: Normocephalic and atraumatic Eyes: PERRL, EOMI, conjunctivae normal, no scleral icterus.  Neck: Supple, Trachea midline .  Cardiovascular: RRR, no MRG, pulses symmetric and intact bilaterally Pulmonary/Chest: normal respiratory effort, CTAB, no wheezes, rales, or rhonchi Abdominal: Soft. Non-tender, non-distended, bowel sounds are normal, no masses, organomegaly, or guarding present.  Musculoskeletal: No joint deformities, erythema, or stiffness Neurological: A&O x3, cranial nerve II-XII are grossly intact, no focal motor deficit Skin: Warm, dry and intact.  Extremities: bilateral LE trace-1+ pitting edema L>R,  chronic venous stasis changes Psychiatric: Normal mood and affect.    Assessment/Plan:  1. AKI on CKD Stage 4- Pt p/w elevated Cr 4.90 which was  ~3 one month ago.  FeNa: 4.7% and Bun/Cr ratio<15 which is not suggestive of prerenal.  Has been eating and drinking well.  CK: 190, not likely rhabdomyolysis.  SPEP pending, ESR elevated but nonspecific, uric acid elevated nonspecific, urine protein/cr ratio:0.89.  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 Mild fullness of the renal pelvis bilaterally. This does not extend into the proximal ureters.  Most likely etiology PKD with the strong FH and renal US suggestive in the setting of likely uncontrolled HTN and DM.  Unfortunately, there is not much we can offer except would advise strict BP control <130/80, HA1c <7, and continued statins for dyslipidemia (pt follows with Dr. Einar Gip). Would consider testing for potential donors for PKD and also an abdominal US to r/o hepatic cysts in the future.    2. Microcytic anemia- hgb 12.0 7/2-->10.8 today; perhaps dilutional given abrupt decline with any s/s of bleeding; possibly related to CKD but would expect more normocytic; anemia panel unrevealing, FOBT?  Pt has not had colonoscopy which should follow-up as an outpatient.   3.  DMII- last HA1c unknown, continue current regimen   4.  Dyslipidemia- continue statin  5.  Lower extremity pain/edema- XR of left foot shows no acute bone abnormalities, but soft tissue swelling, doubt gout given no warmth or erythema, likely peripheral neuropathy and recommend gabapentin; pt states he had an echo done recently by Dr. Einar Gip and told he has "mild leakage" of his valve    Jones Bales, MD PGY-2, Internal Medicine Teaching Service 05/20/2014, 3:03 PM   I have seen and examined this patient and agree with plan as outlined by Dr. Gordy Levan.  Mr. 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  His AKI/CKD may be related to NSAID use but appears to be improving slightly with IVF's.  We discussed the severity and chronicity of his disease as  well as the natural history of ADPKD.  We discussed the ways to delay the progression of CKD including:  Tight BP control with goal <130/80, Tight glucose control with goal HgbA1c <7%, use of an ACE/ARB, and avoidance of nephrotoxic agents such as NSAIDs/COX-II I's/IV contrast.  Given his AKI/CKD would not add ACE/ARB at this time.  We also discussed RRT including but not limited to In-center Hemodialysis, home hemodialysis, peritoneal dialysis and renal transplantation.    His left foot/leg pain may be related to gout or pseudogout and would recommend Ortho eval for fine needle aspiration and microscopic examination of synovial fluid to further evaluate.  Would also consider empiric treatment with colchicine 0.40m daily.  Agree with duplex to r/o DVT but more consistent with an inflammatory arthritic process and/or neuropathic pain. Adithya Difrancesco A,MD 05/20/2014 4:12 PM

## 2014-05-20 NOTE — Progress Notes (Signed)
PATIENT DETAILS Name: Donald Alvarez Age: 48 y.o. Sex: male Date of Birth: 08-30-1966 Admit Date: 05/19/2014 Admitting Physician [name], MD [id number], MD  Subjective: Still with left lower ext pain/swelling  Assessment/Plan: Principal Problem:  Acute on Chronic Renal failure stage 4 -suspect worsening polycystic kidney disease. No evidence of prerenal or post renal etiology at this point -Nephrology to evaluate  Active Problems: Left Leg pain/swelling -uric acid is significantly elevated, suspect gout-start empiric prednisone -await Doppler to rule out DVT  DM -monitor CBG's -c/w SSI  HTN -c/w Coreg  Dyslipidemia -c/w Statins  Anemia -suspect secondary to CKD -await Anemia panel  Disposition: Remain inpatient  DVT Prophylaxis: Prophylactic  Heparin   Code Status: Full code  Family Communication None at bedside  Procedures:  None  CONSULTS:  neurology  Time spent 40 minutes-which includes 50% of the time with face-to-face with patient/ family and coordinating care related to the above assessment and plan.    MEDICATIONS: Scheduled Meds: . amLODipine  10 mg Oral Daily  . atorvastatin  40 mg Oral Daily  . carvedilol  6.25 mg Oral BID WC  . heparin  5,000 Units Subcutaneous 3 times per day  . insulin aspart  0-9 Units Subcutaneous TID WC  . predniSONE  40 mg Oral Q breakfast   Continuous Infusions: . sodium chloride 75 mL/hr at 05/20/14 0222   PRN Meds:.acetaminophen, acetaminophen, ondansetron (ZOFRAN) IV, ondansetron  Antibiotics: Anti-infectives   None       PHYSICAL EXAM: Vital signs in last 24 hours: Filed Vitals:   05/19/14 2232 05/19/14 2337 05/20/14 0206 05/20/14 0500  BP: 160/92 167/88  148/74  Pulse: 75 76  75  Temp: 97.7 F (36.5 C) 97.9 F (36.6 C)  97.7 F (36.5 C)  TempSrc: Oral Oral  Oral  Resp: 16 16  16   Height:  6\' 2"  (1.88 m)    Weight:  84.369 kg (186 lb) 84.369 kg (186 lb)  84.369 kg (186 lb)  SpO2: 99% 98%  98%    Weight change:  Filed Weights   05/19/14 2337 05/20/14 0206 05/20/14 0500  Weight: 84.369 kg (186 lb) 84.369 kg (186 lb) 84.369 kg (186 lb)   Body mass index is 23.87 kg/(m^2).   Gen Exam: Awake and alert with clear speech.   Neck: Supple, No JVD.   Chest: B/L Clear.   CVS: S1 S2 Regular, no murmurs.  Abdomen: soft, BS +, non tender, non distended.  Extremities: no edema, left leg-dorsum-tender, mild;y erythematous and mild edema Neurologic: Non Focal.   Skin: No Rash.   Wounds: N/A.    Intake/Output from previous day: No intake or output data in the 24 hours ending 05/20/14 1132   LAB RESULTS: CBC  Recent Labs Lab 05/19/14 2004 05/20/14 0347  WBC 9.6 8.4  HGB 12.0* 10.8*  HCT 35.9* 32.9*  PLT 187 183  MCV 75.3* 74.3*  MCH 25.2* 24.4*  MCHC 33.4 32.8  RDW 13.5 13.3  LYMPHSABS  --  1.8  MONOABS  --  0.8  EOSABS  --  0.5  BASOSABS  --  0.0    Chemistries   Recent Labs Lab 05/19/14 2004 05/20/14 0347  NA 140 140  K 4.5 4.3  CL 102 104  CO2 20 19  GLUCOSE 100* 114*  BUN 57* 54*  CREATININE 4.90* 4.71*  CALCIUM 9.6 8.9    CBG: No results found for this basename: GLUCAP,  in the last  168 hours  GFR Estimated Creatinine Clearance: 21.8 ml/min (by C-G formula based on Cr of 4.71).  Coagulation profile No results found for this basename: INR, PROTIME,  in the last 168 hours  Cardiac Enzymes No results found for this basename: CK, CKMB, TROPONINI, MYOGLOBIN,  in the last 168 hours  No components found with this basename: POCBNP,  No results found for this basename: DDIMER,  in the last 72 hours No results found for this basename: HGBA1C,  in the last 72 hours No results found for this basename: CHOL, HDL, LDLCALC, TRIG, CHOLHDL, LDLDIRECT,  in the last 72 hours  Recent Labs  05/20/14 0347  TSH 7.050*    Recent Labs  05/20/14 0347  RETICCTPCT 1.1   No results found for this basename: LIPASE,  AMYLASE,  in the last 72 hours  Urine Studies No results found for this basename: UACOL, UAPR, USPG, UPH, UTP, UGL, UKET, UBIL, UHGB, UNIT, UROB, ULEU, UEPI, UWBC, URBC, UBAC, CAST, CRYS, UCOM, BILUA,  in the last 72 hours  MICROBIOLOGY: No results found for this or any previous visit (from the past 240 hour(s)).  RADIOLOGY STUDIES/RESULTS: Dg Chest 2 View  05/19/2014   CLINICAL DATA:  Swelling in left foot for 2 weeks.  EXAM: CHEST  2 VIEW  COMPARISON:  None.  FINDINGS: The heart size and mediastinal contours are within normal limits. Both lungs are clear. The visualized skeletal structures are unremarkable.  IMPRESSION: No active cardiopulmonary disease.   Electronically Signed   By: Kathreen Devoid   On: 05/19/2014 20:54   US Renal  05/20/2014   CLINICAL DATA:  Elevated creatinine  EXAM: RENAL/URINARY TRACT ULTRASOUND COMPLETE  COMPARISON:  None.  FINDINGS: Right Kidney:  Length: 16.2 cm. Multiple cysts of varying sizes are noted throughout the right kidney consistent with polyp cystic kidney disease. Mild fullness of the renal pelvis is noted.  Left Kidney:  Length: 16 cm. Multiple cysts of varying sizes are noted throughout the left kidney. Mild pelvocaliectasis is noted.  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  Mild fullness of the renal pelvis bilaterally. This does not extend into the proximal ureters.   Electronically Signed   By: Inez Catalina M.D.   On: 05/20/2014 07:23   Dg Foot Complete Left  05/19/2014   CLINICAL DATA:  Foot swelling  EXAM: LEFT FOOT - COMPLETE 3+ VIEW  COMPARISON:  None.  FINDINGS: There is no evidence of fracture or dislocation. There is no evidence of arthropathy or other focal bone abnormality. Dorsal soft tissue swelling noted. Small plantar heel spur noted.  IMPRESSION: 1. Dorsal soft tissue swelling. 2. No acute bone abnormalities.   Electronically Signed   By: Kerby Moors M.D.   On:  05/19/2014 Mannie Stabile, MD  [facility] Pager:[phone]  If 7PM-7AM, please contact night-coverage [email or web address] Password [email or web address] 05/20/2014, 11:32 AM   LOS: 1 day   **Disclaimer: This note may have been dictated with voice recognition software. Similar sounding words can inadvertently be transcribed and this note may contain transcription errors which may not have been corrected upon publication of note.**

## 2014-05-20 NOTE — Progress Notes (Signed)
Utilization review completed.  

## 2014-05-21 DIAGNOSIS — M109 Gout, unspecified: Secondary | ICD-10-CM

## 2014-05-21 DIAGNOSIS — N179 Acute kidney failure, unspecified: Principal | ICD-10-CM

## 2014-05-21 LAB — GLUCOSE, CAPILLARY
GLUCOSE-CAPILLARY: 170 mg/dL — AB (ref 70–99)
Glucose-Capillary: 269 mg/dL — ABNORMAL HIGH (ref 70–99)
Glucose-Capillary: 342 mg/dL — ABNORMAL HIGH (ref 70–99)

## 2014-05-21 LAB — RENAL FUNCTION PANEL
ALBUMIN: 3.5 g/dL (ref 3.5–5.2)
ANION GAP: 19 — AB (ref 5–15)
BUN: 61 mg/dL — AB (ref 6–23)
CO2: 15 mEq/L — ABNORMAL LOW (ref 19–32)
Calcium: 9 mg/dL (ref 8.4–10.5)
Chloride: 100 mEq/L (ref 96–112)
Creatinine, Ser: 4.77 mg/dL — ABNORMAL HIGH (ref 0.50–1.35)
GFR calc non Af Amer: 13 mL/min — ABNORMAL LOW (ref >90)
GFR, EST AFRICAN AMERICAN: 15 mL/min — AB (ref >90)
Glucose, Bld: 242 mg/dL — ABNORMAL HIGH (ref 70–99)
Phosphorus: 3 mg/dL (ref 2.3–4.6)
Potassium: 5.4 mEq/L — ABNORMAL HIGH (ref 3.7–5.3)
Sodium: 134 mEq/L — ABNORMAL LOW (ref 137–147)

## 2014-05-21 MED ORDER — INSULIN GLARGINE 100 UNIT/ML ~~LOC~~ SOLN
15.0000 [IU] | Freq: Every day | SUBCUTANEOUS | Status: DC
  Administered 2014-05-21: 15 [IU] via SUBCUTANEOUS
  Filled 2014-05-21: qty 0.15

## 2014-05-21 MED ORDER — SODIUM POLYSTYRENE SULFONATE 15 GM/60ML PO SUSP
30.0000 g | Freq: Once | ORAL | Status: AC
  Administered 2014-05-21: 30 g via ORAL
  Filled 2014-05-21: qty 120

## 2014-05-21 MED ORDER — PREDNISONE 20 MG PO TABS
30.0000 mg | ORAL_TABLET | Freq: Every day | ORAL | Status: DC
  Administered 2014-05-21: 30 mg via ORAL
  Filled 2014-05-21 (×2): qty 1

## 2014-05-21 MED ORDER — INSULIN GLARGINE 100 UNIT/ML ~~LOC~~ SOLN: 12.0000 [IU] | Freq: Every day | SUBCUTANEOUS | Status: DC

## 2014-05-21 MED ORDER — COLCHICINE 0.6 MG PO TABS: 0.6000 mg | ORAL_TABLET | Freq: Every day | ORAL | Status: DC

## 2014-05-21 MED ORDER — INSULIN ASPART 100 UNIT/ML ~~LOC~~ SOLN
0.0000 [IU] | Freq: Three times a day (TID) | SUBCUTANEOUS | Status: DC
Start: 2014-05-21 — End: 2014-05-21
  Administered 2014-05-21: 3 [IU] via SUBCUTANEOUS
  Administered 2014-05-21: 8 [IU] via SUBCUTANEOUS

## 2014-05-21 MED ORDER — PREDNISONE 10 MG PO TABS: ORAL_TABLET | ORAL | Status: DC

## 2014-05-21 MED ORDER — COLCHICINE 0.6 MG PO TABS
0.6000 mg | ORAL_TABLET | Freq: Every day | ORAL | Status: DC
  Administered 2014-05-21: 0.6 mg via ORAL
  Filled 2014-05-21: qty 1

## 2014-05-21 NOTE — Progress Notes (Signed)
Rechecked blood sugar as per pt request is 342 mg/dl, Donald Alvarez , NP notified. NP made aware that pt used to take Insulin lantus daily at bed time and also notified that pt is still c/o leg pain . No new orders given at this time,  stated that blood sugar might bottomed down if we give extra dose of insulin and will discuss with  primary care team in am. Will monitor.

## 2014-05-21 NOTE — Progress Notes (Signed)
S: No complaints today. Leg pain better. Minimal swelling. No SOb, nausea or vomiting. Tolerating oral diet.  O:BP 143/81  Pulse 80  Temp(Src) 97.9 F (36.6 C) (Oral)  Resp 16  Ht 6\' 2"  (1.88 m)  Wt 187 lb 14.4 oz (85.231 kg)  BMI 24.11 kg/m2  SpO2 98%  Intake/Output Summary (Last 24 hours) at 05/21/14 1211 Last data filed at 05/20/14 2300  Gross per 24 hour  Intake    240 ml  Output      0 ml  Net    240 ml   Intake/Output: I/O last 3 completed shifts: In: 240 [P.O.:240] Out: -   Intake/Output this shift:    Weight change: 10 lb 14.4 oz (4.944 kg) Gen: Alert and oriented, sitting in recliner at bedside. CVS: Regular rate, no added sounds Resp: Normal work of breathing, Clear to auscultation. Abd: Full, soft, non tender, bowel sounds present. Ext: hyperpigmented patches, medial side of ankles, trace pitting pedal edema- left.  Recent Labs Lab 05/19/14 2004 05/20/14 0347 05/21/14 0413  NA 140 140 134*  K 4.5 4.3 5.4*  CL 102 104 100  CO2 20 19 15*  GLUCOSE 100* 114* 242*  BUN 57* 54* 61*  CREATININE 4.90* 4.71* 4.77*  ALBUMIN 4.4 3.7 3.5  CALCIUM 9.6 8.9 9.0  PHOS  --  5.1* 3.0  AST 15 11  --   ALT 16 14  --    Liver Function Tests:  Recent Labs Lab 05/19/14 2004 05/20/14 0347 05/21/14 0413  AST 15 11  --   ALT 16 14  --   ALKPHOS 117 101  --   BILITOT 0.3 0.2*  --   PROT 8.8* 7.5  --   ALBUMIN 4.4 3.7 3.5   CBC:  Recent Labs Lab 05/19/14 2004 05/20/14 0347  WBC 9.6 8.4  NEUTROABS  --  5.3  HGB 12.0* 10.8*  HCT 35.9* 32.9*  MCV 75.3* 74.3*  PLT 187 183   Cardiac Enzymes:  Recent Labs Lab 05/20/14 0347  CKTOTAL 190   CBG:  Recent Labs Lab 05/20/14 1710 05/20/14 2212 05/21/14 0009 05/21/14 0755 05/21/14 1145  GLUCAP 165* 353* 342* 170* 269*    Iron Studies:  Recent Labs  05/20/14 0347  IRON 45  TIBC 275  FERRITIN 88   Studies/Results: Dg Chest 2 View  05/19/2014   CLINICAL DATA:  Swelling in left foot for 2 weeks.   EXAM: CHEST  2 VIEW  COMPARISON:  None.  FINDINGS: The heart size and mediastinal contours are within normal limits. Both lungs are clear. The visualized skeletal structures are unremarkable.  IMPRESSION: No active cardiopulmonary disease.   Electronically Signed   By: Kathreen Devoid   On: 05/19/2014 20:54   US Renal  05/20/2014   CLINICAL DATA:  Elevated creatinine  EXAM: RENAL/URINARY TRACT ULTRASOUND COMPLETE  COMPARISON:  None.  FINDINGS: Right Kidney:  Length: 16.2 cm. Multiple cysts of varying sizes are noted throughout the right kidney consistent with polyp cystic kidney disease. Mild fullness of the renal pelvis is noted.  Left Kidney:  Length: 16 cm. Multiple cysts of varying sizes are noted throughout the left kidney. Mild pelvocaliectasis is noted.  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  Mild fullness of the renal pelvis bilaterally. This does not extend into the proximal ureters.   Electronically Signed   By: Inez Catalina M.D.   On: 05/20/2014 07:23  Dg Foot Complete Left  05/19/2014   CLINICAL DATA:  Foot swelling  EXAM: LEFT FOOT - COMPLETE 3+ VIEW  COMPARISON:  None.  FINDINGS: There is no evidence of fracture or dislocation. There is no evidence of arthropathy or other focal bone abnormality. Dorsal soft tissue swelling noted. Small plantar heel spur noted.  IMPRESSION: 1. Dorsal soft tissue swelling. 2. No acute bone abnormalities.   Electronically Signed   By: Kerby Moors M.D.   On: 05/19/2014 20:40   . amLODipine  10 mg Oral Daily  . atorvastatin  40 mg Oral q1800  . carvedilol  6.25 mg Oral BID WC  . colchicine  0.6 mg Oral Daily  . heparin  5,000 Units Subcutaneous 3 times per day  . insulin aspart  0-15 Units Subcutaneous TID WC  . insulin glargine  15 Units Subcutaneous Daily  . predniSONE  30 mg Oral Q breakfast    BMET    Component Value Date/Time   NA 134* 05/21/2014 0413   K 5.4* 05/21/2014 0413    CL 100 05/21/2014 0413   CO2 15* 05/21/2014 0413   GLUCOSE 242* 05/21/2014 0413   BUN 61* 05/21/2014 0413   CREATININE 4.77* 05/21/2014 0413   CALCIUM 9.0 05/21/2014 0413   GFRNONAA 13* 05/21/2014 0413   GFRAA 15* 05/21/2014 0413   CBC    Component Value Date/Time   WBC 8.4 05/20/2014 0347   RBC 4.43 05/20/2014 0347   RBC 4.43 05/20/2014 0347   HGB 10.8* 05/20/2014 0347   HCT 32.9* 05/20/2014 0347   PLT 183 05/20/2014 0347   MCV 74.3* 05/20/2014 0347   MCH 24.4* 05/20/2014 0347   MCHC 32.8 05/20/2014 0347   RDW 13.3 05/20/2014 0347   LYMPHSABS 1.8 05/20/2014 0347   MONOABS 0.8 05/20/2014 0347   EOSABS 0.5 05/20/2014 0347   BASOSABS 0.0 05/20/2014 0347    Assessment/Plan:  1.  Acute on CKD stage 4- Renal [id number] c/w polycystic kidney disease, also family hx of kidney disease. Cr- 4.77 today, no significant change from prior. Will require dialysis as an outpt. Appears stable now, without signs of uremia. Pt thinks baseline Cr is about 3. No identified cause of AKI. - Dialysis education - Outpt close follow up with nephrology - Educated pt on strict blood pressure and glucose control, compliance with meds, to prevent progression of CKD  2. Microcytic anemia- hgb 10.8, stable.  3. DMII- Per primary 4. Dyslipidemia- continue statin  5. Lower extremity pain/edema- On steroid for possible gout. Uric acid elevated. 6. HTN- Per primary.  7. Hyperkalemia- 5.4 today. Given 1 dose of Kayexalate. Consider rechecking before discharge. 8. Metabolic acidosis- 15 today, 19 yesterday.  Defer to outpt mangement.  Denton Brick, Ejiroghene PGY-2 IMTS  Pt seen, examined and agree w A/P as above.  Kelly Splinter MD pager [phone]    cell [phone] 05/30/2014, 4:12 PM

## 2014-05-21 NOTE — Discharge Summary (Signed)
PATIENT DETAILS Name: Donald Alvarez Age: 48 y.o. Sex: male Date of Birth: [birth year] MRN: [id number]. Admit Date: 05/19/2014 Admitting Physician: Donald Patience, MD [id number], MD  Recommendations for Outpatient Follow-up:  1. Optimize Gout medications 2. Will need referral to vascular surgery to plan Hemodialysis access 3. Please check CBC/BMET next visit  PRIMARY DISCHARGE DIAGNOSIS:  Principal Problem:   Renal failure Active Problems:   Neuropathy in diabetes   Plantar fasciitis of left foot   Bilateral edema of lower extremity   HTN (hypertension)   Diabetes mellitus   Anemia   Acute renal failure   Polycystic kidney disease, autosomal dominant   Gout flare      PAST MEDICAL HISTORY: Past Medical History  Diagnosis Date  . Diabetes mellitus without complication   . Hypertension   . Renal disorder   . HLD (hyperlipidemia)     DISCHARGE MEDICATIONS:   Medication List         amLODipine 5 MG tablet  Commonly known as:  NORVASC  Take 10 mg by mouth daily.     aspirin 81 MG tablet  Take 81 mg by mouth daily.     atorvastatin 40 MG tablet  Commonly known as:  LIPITOR  Take 40 mg by mouth daily.     carvedilol 6.25 MG tablet  Commonly known as:  COREG  Take 6.25 mg by mouth 2 (two) times daily with a meal.     colchicine 0.6 MG tablet  Take 1 tablet (0.6 mg total) by mouth daily.     insulin glargine 100 UNIT/ML injection  Commonly known as:  LANTUS  Inject 0.12 mLs (12 Units total) into the skin daily. At 10 am     nateglinide 120 MG tablet  Commonly known as:  STARLIX  Take 120 mg by mouth 3 (three) times daily with meals.     predniSONE 10 MG tablet  Commonly known as:  DELTASONE  - Take 3 tablets (30 mg) daily for 2 days, then,  - Take 20 tablets (20 mg) daily for 2 days, then,  - Take 1 tablets (10 mg) daily for 2 days, then stop     Vitamin D3 3000 UNITS Tabs  Take 1 tablet by mouth daily.       ALLERGIES:     Allergies  Allergen Reactions  . Penicillins Hives    BRIEF HPI:  See H&P, Labs, Consult and Test reports for all details in brief, Donald Alvarez is a 48 y.o. male with history of hypertension diabetes mellitus hyperlipidemia experiencing increasing pain in the left ankle with swelling over the last one week.   CONSULTATIONS:   nephrology  PERTINENT RADIOLOGIC STUDIES: Dg Chest 2 View  05/19/2014   CLINICAL DATA:  Swelling in left foot for 2 weeks.  EXAM: CHEST  2 VIEW  COMPARISON:  None.  FINDINGS: The heart size and mediastinal contours are within normal limits. Both lungs are clear. The visualized skeletal structures are unremarkable.  IMPRESSION: No active cardiopulmonary disease.   Electronically Signed   By: Kathreen Devoid   On: 05/19/2014 20:54   US Renal  05/20/2014   CLINICAL DATA:  Elevated creatinine  EXAM: RENAL/URINARY TRACT ULTRASOUND COMPLETE  COMPARISON:  None.  FINDINGS: Right Kidney:  Length: 16.2 cm. Multiple cysts of varying sizes are noted throughout the right kidney consistent with polyp cystic kidney disease. Mild fullness of the renal pelvis is noted.  Left Kidney:  Length: 16 cm. Multiple cysts of varying sizes  are noted throughout the left kidney. Mild pelvocaliectasis is noted.  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  Mild fullness of the renal pelvis bilaterally. This does not extend into the proximal ureters.   Electronically Signed   By: Inez Catalina M.D.   On: 05/20/2014 07:23   Dg Foot Complete Left  05/19/2014   CLINICAL DATA:  Foot swelling  EXAM: LEFT FOOT - COMPLETE 3+ VIEW  COMPARISON:  None.  FINDINGS: There is no evidence of fracture or dislocation. There is no evidence of arthropathy or other focal bone abnormality. Dorsal soft tissue swelling noted. Small plantar heel spur noted.  IMPRESSION: 1. Dorsal soft tissue swelling. 2. No acute bone abnormalities.   Electronically Signed    By: Kerby Moors M.D.   On: 05/19/2014 20:40     PERTINENT LAB RESULTS: CBC:  Recent Labs  05/19/14 2004 05/20/14 0347  WBC 9.6 8.4  HGB 12.0* 10.8*  HCT 35.9* 32.9*  PLT 187 183   CMET CMP     Component Value Date/Time   NA 134* 05/21/2014 0413   K 5.4* 05/21/2014 0413   CL 100 05/21/2014 0413   CO2 15* 05/21/2014 0413   GLUCOSE 242* 05/21/2014 0413   BUN 61* 05/21/2014 0413   CREATININE 4.77* 05/21/2014 0413   CALCIUM 9.0 05/21/2014 0413   PROT 7.5 05/20/2014 0347   ALBUMIN 3.5 05/21/2014 0413   AST 11 05/20/2014 0347   ALT 14 05/20/2014 0347   ALKPHOS 101 05/20/2014 0347   BILITOT 0.2* 05/20/2014 0347   GFRNONAA 13* 05/21/2014 0413   GFRAA 15* 05/21/2014 0413    GFR Estimated Creatinine Clearance: 21.5 ml/min (by C-G formula based on Cr of 4.77). No results found for this basename: LIPASE, AMYLASE,  in the last 72 hours  Recent Labs  05/20/14 0347  CKTOTAL 190   No components found with this basename: POCBNP,  No results found for this basename: DDIMER,  in the last 72 hours No results found for this basename: HGBA1C,  in the last 72 hours No results found for this basename: CHOL, HDL, LDLCALC, TRIG, CHOLHDL, LDLDIRECT,  in the last 72 hours  Recent Labs  05/20/14 0347  TSH 7.050*    Recent Labs  05/20/14 0347  VITAMINB12 479  FOLATE 11.8  FERRITIN 88  TIBC 275  IRON 45  RETICCTPCT 1.1   Coags: No results found for this basename: PT, INR,  in the last 72 hours Microbiology: No results found for this or any previous visit (from the past 240 hour(s)).   BRIEF HOSPITAL COURSE:   Principal Problem: Acute on Chronic Renal failure stage 4  -suspect worsening polycystic kidney disease. No evidence of prerenal or post renal etiology at this point  -appreciate Nephrology evaluation, plans are for close outpatient monitoring and preparing patient for possible Hemodialysis soon,patient has a previously scheduled appt with Dr Donald Alvarez on 7/27, he has been asked to keep this  appt.  Active Problems:  Left Leg pain/swelling-acute gouty arthritis  -uric acid is significantly elevated, suspected gout-significantly improved with prednisone-will taper rapidly, and start low dose colcichine.Since whole dorsum of left foot involved, and no particular joint involved-arthrocentesis not done.  - Doppler neg for DVT   DM  -CBG's-elevated-but secondary to prednisone  -managed with Lantus and  SSI-have asked patient to take Lantus daily (previously on prn dose) while he is on prednisone.  HTN  -c/w Coreg and  amlodipine   Dyslipidemia  -c/w Statins   Anemia  -suspect secondary to CKD -will require close monitoring and starting Aranesp in the near futures. Will defer to his outpatient MD  TODAY-DAY OF DISCHARGE:  Subjective:   Kayhan Waszak today has no headache,no chest abdominal pain,no new weakness tingling or numbness, feels much better wants to go home today.   Objective:   Blood pressure 159/97, pulse 91, temperature 97.7 F (36.5 C), temperature source Oral, resp. rate 16, height 6\' 2"  (1.88 m), weight 85.231 kg (187 lb 14.4 oz), SpO2 99.00%.  Intake/Output Summary (Last 24 hours) at 05/21/14 1507 Last data filed at 05/20/14 2300  Gross per 24 hour  Intake    240 ml  Output      0 ml  Net    240 ml   Filed Weights   05/20/14 0206 05/20/14 0500 05/21/14 0442  Weight: 84.369 kg (186 lb) 84.369 kg (186 lb) 85.231 kg (187 lb 14.4 oz)    Exam Awake Alert, Oriented *3, No new F.N deficits, Normal affect [place].AT,PERRAL Supple Neck,No JVD, No cervical lymphadenopathy appriciated.  Symmetrical Chest wall movement, Good air movement bilaterally, CTAB RRR,No Gallops,Rubs or new Murmurs, No Parasternal Heave +ve B.Sounds, Abd Soft, Non tender, No organomegaly appriciated, No rebound -guarding or rigidity. No Cyanosis, Clubbing or edema, No new Rash or bruise  DISCHARGE CONDITION: Stable  DISPOSITION: Home  DISCHARGE INSTRUCTIONS:    Activity:  As  tolerated   Diet recommendation: Diabetic Diet Heart Healthy diet      Discharge Instructions   Call MD for:  persistant nausea and vomiting    Complete by:  As directed      Call MD for:  severe uncontrolled pain    Complete by:  As directed      Diet - low sodium heart healthy    Complete by:  As directed      Diet Carb Modified    Complete by:  As directed      Increase activity slowly    Complete by:  As directed            Follow-up Information   Follow up with OSEI-BONSU,GEORGE, MD. Schedule an appointment as soon as possible for a visit in 1 week.   Specialty:  Internal Medicine   Contact information:   [id number] [place] [address] [phone]       Follow up with Louis Meckel, MD On 06/13/2014. (keep this appt)    Specialty:  Nephrology   Contact information:   [place] [address] [phone]      Total Time spent on discharge equals 45 minutes.  SignedOren Binet 05/21/2014 3:07 PM  **Disclaimer: This note may have been dictated with voice recognition software. Similar sounding words can inadvertently be transcribed and this note may contain transcription errors which may not have been corrected upon publication of note.**

## 2014-05-21 NOTE — Progress Notes (Signed)
Pt given discharge instructions and prescriptions.  PIV removed.  Pt ambulated to discharge location with tech.

## 2014-05-21 NOTE — Progress Notes (Signed)
PATIENT DETAILS Name: Donald Alvarez Age: 48 y.o. Sex: male Date of Birth: [birth year] Admit Date: 05/19/2014 Admitting Physician [name], MD [id number], MD  Subjective: Left leg pain/swelling significantly improved with prednisone, no major complaints  Assessment/Plan: Principal Problem:  Acute on Chronic Renal failure stage 4 -suspect worsening polycystic kidney disease. No evidence of prerenal or post renal etiology at this point -appreciate Nephrology evaluation  Active Problems: Left Leg pain/swelling-acute gouty arthritis -uric acid is significantly elevated, suspected gout-significantly improved with prednisone-will taper rapidly, and start low dose colcichine.Since whole dorsum of left foot involved, and no particular joint involved-arthrocentesis not done. - Doppler neg for DVT  DM -CBG's-elevated-but secondary to prednisone -c/w SSI-change to moderate, start Lantus 15 units from today  HTN -c/w Coreg and amlodipine  Dyslipidemia -c/w Statins  Anemia -suspect secondary to CKD  Disposition: Remain inpatient-home later today-if ok with nephrology  DVT Prophylaxis: Prophylactic  Heparin   Code Status: Full code  Family Communication None at bedside  Procedures:  None  CONSULTS:  neurology   MEDICATIONS: Scheduled Meds: . amLODipine  10 mg Oral Daily  . atorvastatin  40 mg Oral q1800  . carvedilol  6.25 mg Oral BID WC  . colchicine  0.6 mg Oral Daily  . heparin  5,000 Units Subcutaneous 3 times per day  . insulin aspart  0-15 Units Subcutaneous TID WC  . insulin glargine  15 Units Subcutaneous Daily  . predniSONE  30 mg Oral Q breakfast   Continuous Infusions:   PRN Meds:.acetaminophen, acetaminophen, ondansetron (ZOFRAN) IV, ondansetron  Antibiotics: Anti-infectives   None       PHYSICAL EXAM: Vital signs in last 24 hours: Filed Vitals:   05/20/14 0500 05/20/14 1301 05/20/14 1948 05/21/14 0442  BP:  148/74 127/80 148/78 143/81  Pulse: 75 73 71 80  Temp: 97.7 F (36.5 C) 98 F (36.7 C) 97.9 F (36.6 C) 97.9 F (36.6 C)  TempSrc: Oral Oral Oral Oral  Resp: 16 16 16 16   Height:      Weight: 84.369 kg (186 lb)   85.231 kg (187 lb 14.4 oz)  SpO2: 98% 98% 97% 98%    Weight change: 4.944 kg (10 lb 14.4 oz) Filed Weights   05/20/14 0206 05/20/14 0500 05/21/14 0442  Weight: 84.369 kg (186 lb) 84.369 kg (186 lb) 85.231 kg (187 lb 14.4 oz)   Body mass index is 24.11 kg/(m^2).   Gen Exam: Awake and alert with clear speech.   Neck: Supple, No JVD.   Chest: B/L Clear.   CVS: S1 S2 Regular, no murmurs.  Abdomen: soft, BS +, non tender, non distended.  Extremities: no edema, left dorsum foot-no swelling or erythema Neurologic: Non Focal.   Skin: No Rash.   Wounds: N/A.    Intake/Output from previous day:  Intake/Output Summary (Last 24 hours) at 05/21/14 0817 Last data filed at 05/20/14 2300  Gross per 24 hour  Intake    240 ml  Output      0 ml  Net    240 ml     LAB RESULTS: CBC  Recent Labs Lab 05/19/14 2004 05/20/14 0347  WBC 9.6 8.4  HGB 12.0* 10.8*  HCT 35.9* 32.9*  PLT 187 183  MCV 75.3* 74.3*  MCH 25.2* 24.4*  MCHC 33.4 32.8  RDW 13.5 13.3  LYMPHSABS  --  1.8  MONOABS  --  0.8  EOSABS  --  0.5  BASOSABS  --  0.0    Chemistries   Recent Labs Lab 05/19/14 2004 05/20/14 0347 05/21/14 0413  NA 140 140 134*  K 4.5 4.3 5.4*  CL 102 104 100  CO2 20 19 15*  GLUCOSE 100* 114* 242*  BUN 57* 54* 61*  CREATININE 4.90* 4.71* 4.77*  CALCIUM 9.6 8.9 9.0    CBG:  Recent Labs Lab 05/20/14 1206 05/20/14 1710 05/20/14 2212 05/21/14 0009 05/21/14 0755  GLUCAP 183* 165* 353* 342* 170*    GFR Estimated Creatinine Clearance: 21.5 ml/min (by C-G formula based on Cr of 4.77).  Coagulation profile No results found for this basename: INR, PROTIME,  in the last 168 hours  Cardiac Enzymes No results found for this basename: CK, CKMB, TROPONINI,  MYOGLOBIN,  in the last 168 hours  No components found with this basename: POCBNP,  No results found for this basename: DDIMER,  in the last 72 hours No results found for this basename: HGBA1C,  in the last 72 hours No results found for this basename: CHOL, HDL, LDLCALC, TRIG, CHOLHDL, LDLDIRECT,  in the last 72 hours  Recent Labs  05/20/14 0347  TSH 7.050*    Recent Labs  05/20/14 0347  VITAMINB12 479  FOLATE 11.8  FERRITIN 88  TIBC 275  IRON 45  RETICCTPCT 1.1   No results found for this basename: LIPASE, AMYLASE,  in the last 72 hours  Urine Studies No results found for this basename: UACOL, UAPR, USPG, UPH, UTP, UGL, UKET, UBIL, UHGB, UNIT, UROB, ULEU, UEPI, UWBC, URBC, UBAC, CAST, CRYS, UCOM, BILUA,  in the last 72 hours  MICROBIOLOGY: No results found for this or any previous visit (from the past 240 hour(s)).  RADIOLOGY STUDIES/RESULTS: Dg Chest 2 View  05/19/2014   CLINICAL DATA:  Swelling in left foot for 2 weeks.  EXAM: CHEST  2 VIEW  COMPARISON:  None.  FINDINGS: The heart size and mediastinal contours are within normal limits. Both lungs are clear. The visualized skeletal structures are unremarkable.  IMPRESSION: No active cardiopulmonary disease.   Electronically Signed   By: Kathreen Devoid   On: 05/19/2014 20:54   US Renal  05/20/2014   CLINICAL DATA:  Elevated creatinine  EXAM: RENAL/URINARY TRACT ULTRASOUND COMPLETE  COMPARISON:  None.  FINDINGS: Right Kidney:  Length: 16.2 cm. Multiple cysts of varying sizes are noted throughout the right kidney consistent with polyp cystic kidney disease. Mild fullness of the renal pelvis is noted.  Left Kidney:  Length: 16 cm. Multiple cysts of varying sizes are noted throughout the left kidney. Mild pelvocaliectasis is noted.  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  Mild fullness of the renal pelvis bilaterally. This does not extend into the proximal  ureters.   Electronically Signed   By: Inez Catalina M.D.   On: 05/20/2014 07:23   Dg Foot Complete Left  05/19/2014   CLINICAL DATA:  Foot swelling  EXAM: LEFT FOOT - COMPLETE 3+ VIEW  COMPARISON:  None.  FINDINGS: There is no evidence of fracture or dislocation. There is no evidence of arthropathy or other focal bone abnormality. Dorsal soft tissue swelling noted. Small plantar heel spur noted.  IMPRESSION: 1. Dorsal soft tissue swelling. 2. No acute bone abnormalities.   Electronically Signed   By: Kerby Moors M.D.   On: 05/19/2014 Mannie Stabile, MD  [facility] Pager:[phone]  If 7PM-7AM, please contact night-coverage [email or web address] Password [email or web address]  05/21/2014, 8:17 AM   LOS: 2 days   **Disclaimer: This note may have been dictated with voice recognition software. Similar sounding words can inadvertently be transcribed and this note may contain transcription errors which may not have been corrected upon publication of note.**

## 2014-05-21 NOTE — Discharge Instructions (Signed)
Follow with Primary MD  [name], MD  and other consultant as instructed your Hospitalist MD  Please get a complete blood count and chemistry panel checked by your Primary MD at your next visit, and again as instructed by your Primary MD.  Get Medicines reviewed and adjusted. Please take all your medications with you for your next visit with your Primary MD  Please request your Primary MD to go over all hospital tests and procedure/radiological results at the follow up, please ask your Primary MD to get all Hospital records sent to his/her office.  If you experience worsening of your admission symptoms, develop shortness of breath, life threatening emergency, suicidal or homicidal thoughts you must seek medical attention immediately by calling 911 or calling your MD immediately  if symptoms less severe.  You must read complete instructions/literature along with all the possible adverse reactions/side effects for all the Medicines you take and that have been prescribed to you. Take any new Medicines after you have completely understood and accpet all the possible adverse reactions/side effects.   Do not drive when taking Pain medications.   Do not take more than prescribed Pain, Sleep and Anxiety Medications  Special Instructions: If you have smoked or chewed Tobacco  in the last 2 yrs please stop smoking, stop any regular Alcohol  and or any Recreational drug use.  Wear Seat belts while driving.  Please note  You were cared for by a hospitalist during your hospital stay. Once you are discharged, your primary care physician will handle any further medical issues. Please note that NO REFILLS for any discharge medications will be authorized once you are discharged, as it is imperative that you return to your primary care physician (or establish a relationship with a primary care physician if you do not have one) for your aftercare needs so that they can reassess your need for medications  and monitor your lab values.

## 2014-05-23 LAB — GLUCOSE, CAPILLARY: Glucose-Capillary: 125 mg/dL — ABNORMAL HIGH (ref 70–99)

## 2014-05-23 NOTE — ED Provider Notes (Signed)
Medical screening examination/treatment/procedure(s) were conducted as a shared visit with non-physician practitioner(s) and myself.  I personally evaluated the patient during the encounter.   Please see my separate note.     Houston Siren III, MD 05/23/14 [phone]

## 2014-05-24 LAB — UIFE/LIGHT CHAINS/TP QN, 24-HR UR
ALPHA 1 UR: DETECTED — AB
Albumin, U: DETECTED
Alpha 2, Urine: DETECTED — AB
Beta, Urine: DETECTED — AB
FREE KAPPA LT CHAINS, UR: 16.2 mg/dL — AB (ref 0.14–2.42)
Free Kappa/Lambda Ratio: 6.95 ratio (ref 2.04–10.37)
Free Lambda Lt Chains,Ur: 2.33 mg/dL — ABNORMAL HIGH (ref 0.02–0.67)
Gamma Globulin, Urine: DETECTED — AB
Total Protein, Urine: 45.7 mg/dL

## 2014-05-24 LAB — PROTEIN ELECTROPHORESIS, SERUM
ALBUMIN ELP: 53.8 % — AB (ref 55.8–66.1)
Alpha-1-Globulin: 4.5 % (ref 2.9–4.9)
Alpha-2-Globulin: 11.1 % (ref 7.1–11.8)
BETA GLOBULIN: 6.5 % (ref 4.7–7.2)
Beta 2: 6.2 % (ref 3.2–6.5)
Gamma Globulin: 17.9 % (ref 11.1–18.8)
M-Spike, %: NOT DETECTED g/dL
TOTAL PROTEIN ELP: 6.9 g/dL (ref 6.0–8.3)

## 2014-10-11 DIAGNOSIS — I151 Hypertension secondary to other renal disorders: Secondary | ICD-10-CM | POA: Insufficient documentation

## 2014-10-11 DIAGNOSIS — M1A0720 Idiopathic chronic gout, left ankle and foot, without tophus (tophi): Secondary | ICD-10-CM | POA: Insufficient documentation

## 2014-12-06 ENCOUNTER — Encounter: Payer: Self-pay | Admitting: Vascular Surgery

## 2014-12-06 ENCOUNTER — Other Ambulatory Visit: Payer: Self-pay | Admitting: *Deleted

## 2014-12-06 DIAGNOSIS — Z01810 Encounter for preprocedural cardiovascular examination: Secondary | ICD-10-CM

## 2014-12-06 DIAGNOSIS — N184 Chronic kidney disease, stage 4 (severe): Secondary | ICD-10-CM

## 2014-12-30 ENCOUNTER — Encounter: Payer: Self-pay | Admitting: Vascular Surgery

## 2015-01-03 ENCOUNTER — Encounter: Payer: Self-pay | Admitting: Vascular Surgery

## 2015-01-03 ENCOUNTER — Ambulatory Visit (HOSPITAL_COMMUNITY)
Admission: RE | Admit: 2015-01-03 | Discharge: 2015-01-03 | Disposition: A | Discharge status: Home / Self Care | Payer: BLUE CROSS/BLUE SHIELD | Source: Ambulatory Visit | Attending: Vascular Surgery | Admitting: Vascular Surgery

## 2015-01-03 ENCOUNTER — Ambulatory Visit (INDEPENDENT_AMBULATORY_CARE_PROVIDER_SITE_OTHER): Payer: BLUE CROSS/BLUE SHIELD | Admitting: Vascular Surgery

## 2015-01-03 VITALS — BP 139/84 | HR 71 | Ht 74.0 in | Wt 183.0 lb

## 2015-01-03 DIAGNOSIS — Z01810 Encounter for preprocedural cardiovascular examination: Secondary | ICD-10-CM

## 2015-01-03 DIAGNOSIS — N184 Chronic kidney disease, stage 4 (severe): Secondary | ICD-10-CM

## 2015-01-03 NOTE — Progress Notes (Signed)
Patient name: Donald Alvarez MRN: [id number] DOB: 1966/09/20 Sex: male   Referred by: [name]  Reason for referral:  Chief Complaint  Patient presents with  . New Evaluation    eval for access placement    HISTORY OF PRESENT ILLNESS: Patient presents today for discussion of access for hemodialysis. He is not on dialysis currently and has had progressive renal insufficiency. He states he is wishing to avoid hemodialysis as long as possible. I did explain that our role is to provide access for hemodialysis and would not alter his need for hemodialysis.  Past Medical History  Diagnosis Date  . Diabetes mellitus without complication   . Hypertension   . Renal disorder   . HLD (hyperlipidemia)   . Gout   . Anemia   . Hyperparathyroidism due to renal insufficiency     Past Surgical History  Procedure Laterality Date  . No past surgeries      History   Social History  . Marital Status: Married    Spouse Name: N/A  . Number of Children: N/A  . Years of Education: N/A   Occupational History  . Not on file.   Social History Main Topics  . Smoking status: Never Smoker   . Smokeless tobacco: Never Used  . Alcohol Use: No  . Drug Use: No  . Sexual Activity: Not on file   Other Topics Concern  . Not on file   Social History Narrative    Family History  Problem Relation Age of Onset  . Diabetes Mellitus II Mother   . Kidney disease Mother   . Diabetes Mother   . Hypertension Mother   . CAD Father   . Heart disease Father   . Hypertension Father     Allergies as of 01/03/2015 - Review Complete 01/03/2015  Allergen Reaction Noted  . Fenofibrate  12/06/2014  . Lisinopril Hives 12/06/2014  . Penicillins Hives 11/23/2013  . Ultrasound gel Itching and Rash 01/03/2015    Current Outpatient Prescriptions on File Prior to Visit  Medication Sig Dispense Refill  . amLODipine (NORVASC) 5 MG tablet Take 10 mg by mouth daily.     Marland Kitchen aspirin 81 MG tablet Take  81 mg by mouth daily.    Marland Kitchen atorvastatin (LIPITOR) 40 MG tablet Take 40 mg by mouth daily.    . carvedilol (COREG) 6.25 MG tablet Take 6.25 mg by mouth 2 (two) times daily with a meal.    . Cholecalciferol (VITAMIN D3) 3000 UNITS TABS Take 1 tablet by mouth daily.    . nateglinide (STARLIX) 120 MG tablet Take 120 mg by mouth 3 (three) times daily with meals.    . colchicine 0.6 MG tablet Take 1 tablet (0.6 mg total) by mouth daily. (Patient not taking: Reported on 01/03/2015) 30 tablet 0  . insulin glargine (LANTUS) 100 UNIT/ML injection Inject 0.12 mLs (12 Units total) into the skin daily. At 10 am (Patient not taking: Reported on 01/03/2015) 10 mL 11  . predniSONE (DELTASONE) 10 MG tablet Take 3 tablets (30 mg) daily for 2 days, then, Take 20 tablets (20 mg) daily for 2 days, then, Take 1 tablets (10 mg) daily for 2 days, then stop (Patient not taking: Reported on 01/03/2015) 12 tablet 0   No current facility-administered medications on file prior to visit.     REVIEW OF SYSTEMS:  Positives indicated with an "X"  CARDIOVASCULAR:  [ ]  chest pain   [ ]  chest pressure   [ ]   palpitations   [ ]  orthopnea   [x ] dyspnea on exertion   [ ]  claudication   [ ]  rest pain   [ ]  DVT   [ ]  phlebitis PULMONARY:   [ ]  productive cough   [ ]  asthma   [ ]  wheezing NEUROLOGIC:   [ ]  weakness  [ x] paresthesias  [ ]  aphasia  [ ]  amaurosis  [ ]  dizziness HEMATOLOGIC:   [ ]  bleeding problems   [ ]  clotting disorders MUSCULOSKELETAL:  [ ]  joint pain   [ ]  joint swelling GASTROINTESTINAL: [ ]   blood in stool  [ ]   hematemesis GENITOURINARY:  [ ]   dysuria  [ ]   hematuria PSYCHIATRIC:  [ ]  history of major depression INTEGUMENTARY:  [ x] rashes  [ ]  ulcers CONSTITUTIONAL:  [ ]  fever   [ ]  chills  PHYSICAL EXAMINATION:  General: The patient is a well-nourished male, in no acute distress. Vital signs are BP 139/84 mmHg  Pulse 71  Ht 6\' 2"  (1.88 m)  Wt 183 lb (83.008 kg)  BMI 23.49 kg/m2  SpO2  100% Pulmonary: There is a good air exchange   Musculoskeletal: There are no major deformities.  There is no significant extremity pain. Neurologic: No focal weakness or paresthesias are detected, Skin: There are no ulcer or rashes noted. Psychiatric: The patient has normal affect. Cardiovascular: 2+ radial pulses bilaterally Very nice surface veins that run throughout his forearms to the antecubital space bilaterally   He was scheduled for vein mapping today. Interestingly he has had a severe reaction to sonic gel in the ultrasounds of his aorta and other issues. Before he did not have vein mapping today  Impression and Plan:  I had a very long discussion with patient regarding accessed for hemodialysis. I explained options for catheter for acute hemodialysis and AV fistula and AV graft. I explained the benefits and disadvantages of each of these. He does have very nice cephalic vein bilaterally. He is right-handed. He has normal radial pulses bilaterally. I have recommended a left radiocephalic Cimino AV fistula creation. I explained the benefit would be to do this as soon as possible to allow the most appropriate maturation prior to need for hemodialysis. Also explained there is no downside of having the fistula patent even if he does not go on hemodialysis. He wishes to consider this and discuss this further with Dr. [name]. He will call us when he wishes to schedule his outpatient procedure    EARLY, TODD [facility] [place] Office: [phone]

## 2015-03-13 ENCOUNTER — Encounter: Payer: Self-pay | Admitting: Vascular Surgery

## 2015-03-14 ENCOUNTER — Ambulatory Visit: Payer: BLUE CROSS/BLUE SHIELD | Admitting: Vascular Surgery

## 2015-05-01 ENCOUNTER — Encounter: Payer: Self-pay | Admitting: Vascular Surgery

## 2015-05-04 ENCOUNTER — Ambulatory Visit (INDEPENDENT_AMBULATORY_CARE_PROVIDER_SITE_OTHER): Payer: BLUE CROSS/BLUE SHIELD | Admitting: Vascular Surgery

## 2015-05-04 ENCOUNTER — Encounter: Payer: Self-pay | Admitting: Vascular Surgery

## 2015-05-04 ENCOUNTER — Other Ambulatory Visit: Payer: Self-pay | Admitting: Vascular Surgery

## 2015-05-04 ENCOUNTER — Ambulatory Visit (HOSPITAL_COMMUNITY)
Admission: RE | Admit: 2015-05-04 | Discharge: 2015-05-04 | Disposition: A | Discharge status: Home / Self Care | Payer: BLUE CROSS/BLUE SHIELD | Source: Ambulatory Visit | Attending: Vascular Surgery | Admitting: Vascular Surgery

## 2015-05-04 ENCOUNTER — Ambulatory Visit (INDEPENDENT_AMBULATORY_CARE_PROVIDER_SITE_OTHER)
Admission: RE | Admit: 2015-05-04 | Discharge: 2015-05-04 | Disposition: A | Discharge status: Home / Self Care | Payer: BLUE CROSS/BLUE SHIELD | Source: Ambulatory Visit | Attending: Vascular Surgery | Admitting: Vascular Surgery

## 2015-05-04 VITALS — BP 132/89 | HR 66 | Ht 74.0 in | Wt 179.0 lb

## 2015-05-04 DIAGNOSIS — Z01810 Encounter for preprocedural cardiovascular examination: Secondary | ICD-10-CM | POA: Insufficient documentation

## 2015-05-04 DIAGNOSIS — N186 End stage renal disease: Secondary | ICD-10-CM | POA: Diagnosis not present

## 2015-05-04 DIAGNOSIS — N189 Chronic kidney disease, unspecified: Secondary | ICD-10-CM | POA: Diagnosis not present

## 2015-05-04 DIAGNOSIS — N184 Chronic kidney disease, stage 4 (severe): Secondary | ICD-10-CM

## 2015-05-04 NOTE — Progress Notes (Signed)
Brief History and Physical  History of Present Illness  Donald Alvarez is a 49 y.o. male who presents with chief complaint: CKD stage IV-V .  The patient presents today for follow up discussion about proceeding with AV fistula creation.  He was seen by Dr. Donnetta Hutching back in February 2016 at that time he was not and still is not on HD.  He reports no medical changes since his last visit.  Dr. Vanetta Mulders has told him he will need HD soon.    Past Medical History  Diagnosis Date  . Diabetes mellitus without complication   . Hypertension   . Renal disorder   . HLD (hyperlipidemia)   . Gout   . Anemia   . Hyperparathyroidism due to renal insufficiency     Past Surgical History  Procedure Laterality Date  . No past surgeries      History   Social History  . Marital Status: Married    Spouse Name: N/A  . Number of Children: N/A  . Years of Education: N/A   Occupational History  . Not on file.   Social History Main Topics  . Smoking status: Never Smoker   . Smokeless tobacco: Never Used  . Alcohol Use: No  . Drug Use: No  . Sexual Activity: Not on file   Other Topics Concern  . Not on file   Social History Narrative    Family History  Problem Relation Age of Onset  . Diabetes Mellitus II Mother   . Kidney disease Mother   . Diabetes Mother   . Hypertension Mother   . CAD Father   . Heart disease Father   . Hypertension Father     Current Outpatient Prescriptions on File Prior to Visit  Medication Sig Dispense Refill  . amLODipine (NORVASC) 5 MG tablet Take 10 mg by mouth daily.     Marland Kitchen aspirin 81 MG tablet Take 81 mg by mouth daily.    Marland Kitchen atorvastatin (LIPITOR) 40 MG tablet Take 40 mg by mouth daily.    . carvedilol (COREG) 6.25 MG tablet Take 6.25 mg by mouth 2 (two) times daily with a meal.    . Cholecalciferol (VITAMIN D3) 3000 UNITS TABS Take 1 tablet by mouth daily.    . nateglinide (STARLIX) 120 MG tablet Take 120 mg by mouth 3 (three) times daily  with meals.    . colchicine 0.6 MG tablet Take 1 tablet (0.6 mg total) by mouth daily. (Patient not taking: Reported on 01/03/2015) 30 tablet 0  . insulin glargine (LANTUS) 100 UNIT/ML injection Inject 0.12 mLs (12 Units total) into the skin daily. At 10 am (Patient not taking: Reported on 01/03/2015) 10 mL 11  . predniSONE (DELTASONE) 10 MG tablet Take 3 tablets (30 mg) daily for 2 days, then, Take 20 tablets (20 mg) daily for 2 days, then, Take 1 tablets (10 mg) daily for 2 days, then stop (Patient not taking: Reported on 01/03/2015) 12 tablet 0   No current facility-administered medications on file prior to visit.    Allergies  Allergen Reactions  . Fenofibrate   . Lisinopril Hives  . Penicillins Hives  . Ultrasound Gel Itching and Rash    Review of Systems: As listed above, otherwise negative.  Physical Examination  Filed Vitals:   05/04/15 1341  BP: 132/89  Pulse: 66  Height: 6\' 2"  (1.88 m)  Weight: 179 lb (81.194 kg)  SpO2: 98%    General: A&O x 3, WDWN  Pulmonary: Sym exp, good air movt, CTAB, no rales, rhonchi, & wheezing  Cardiac: RRR, Nl S1, S2, no Murmurs, rubs or gallops  Gastrointestinal: soft, NTND, -G/R, - HSM, - masses, - CVAT B  Musculoskeletal: M/S 5/5 throughout , Extremities without ischemic changes  Vascular palpable radial pulses bilaterally  Laboratory See Koppel is a 49 y.o. male who presents with: ESRD not currently on HD.  He is right handed and he wishes to have a left upper arm AV fistula creation he doesn't want a fore arm graft.  The cephalic vein bilaterally looks very acceptable.  He understands the benefits and risk of the procedure.  He is going to call us to schedule his procedure in July.   Theda Sers Rita [facility] PA-C [facility] [place] Office: [phone] Pager: [phone]  05/04/2015, 1:46 PM

## 2015-07-03 ENCOUNTER — Other Ambulatory Visit: Payer: Self-pay

## 2015-07-04 ENCOUNTER — Other Ambulatory Visit (HOSPITAL_COMMUNITY): Payer: Self-pay | Admitting: *Deleted

## 2015-07-05 ENCOUNTER — Encounter (HOSPITAL_COMMUNITY)
Admission: RE | Admit: 2015-07-05 | Discharge: 2015-07-05 | Disposition: A | Discharge status: Home / Self Care | Payer: BLUE CROSS/BLUE SHIELD | Source: Ambulatory Visit | Attending: Nephrology | Admitting: Nephrology

## 2015-07-05 DIAGNOSIS — N184 Chronic kidney disease, stage 4 (severe): Secondary | ICD-10-CM | POA: Insufficient documentation

## 2015-07-05 DIAGNOSIS — D631 Anemia in chronic kidney disease: Secondary | ICD-10-CM | POA: Insufficient documentation

## 2015-07-05 DIAGNOSIS — D509 Iron deficiency anemia, unspecified: Secondary | ICD-10-CM | POA: Insufficient documentation

## 2015-07-05 MED ORDER — SODIUM CHLORIDE 0.9 % IV SOLN
510.0000 mg | INTRAVENOUS | Status: DC
  Administered 2015-07-05: 510 mg via INTRAVENOUS
  Filled 2015-07-05: qty 17

## 2015-07-14 ENCOUNTER — Encounter (HOSPITAL_COMMUNITY): Payer: Self-pay | Admitting: *Deleted

## 2015-07-14 NOTE — Progress Notes (Signed)
Pt denies SOB and chest pain but is under the care of Dr. Bynum Bellows, cardiology. Pt denies having a cardiac cath  but stated that an echo and stress test was done. Pt stated that an EKG was done by cardiologist and PCP; all cardiac records requested. Pt stated that a neurologist advised that his blood pressure be monitored closely during any surgical procedure; this was stated by the neurologist after completing a required brain scan for the renal transplant list. Pt made aware to stop NSAID's, otc vitamins and herbal medications. Pt verbalized understanding of all pre-op instructions.

## 2015-07-14 NOTE — Progress Notes (Signed)
   07/14/15 1119  OBSTRUCTIVE SLEEP APNEA  Have you ever been diagnosed with sleep apnea through a sleep study? No  Do you snore loudly (loud enough to be heard through closed doors)?  1  Do you often feel tired, fatigued, or sleepy during the daytime? 0  Has anyone observed you stop breathing during your sleep? 0  Do you have, or are you being treated for high blood pressure? 1  BMI more than 35 kg/m2? 0  Age over 51 years old? 1  Neck circumference greater than 40 cm/16 inches? 0  Gender: 1  Obstructive Sleep Apnea Score 4

## 2015-07-17 ENCOUNTER — Ambulatory Visit (HOSPITAL_COMMUNITY)
Admission: RE | Admit: 2015-07-17 | Discharge: 2015-07-17 | Disposition: A | Discharge status: Home / Self Care | Payer: BLUE CROSS/BLUE SHIELD | Source: Ambulatory Visit | Attending: Vascular Surgery | Admitting: Vascular Surgery

## 2015-07-17 ENCOUNTER — Encounter (HOSPITAL_COMMUNITY)
Admission: RE | Disposition: A | Discharge status: Home / Self Care | Payer: Self-pay | Source: Ambulatory Visit | Attending: Vascular Surgery

## 2015-07-17 ENCOUNTER — Ambulatory Visit (HOSPITAL_COMMUNITY): Payer: BLUE CROSS/BLUE SHIELD | Admitting: Anesthesiology

## 2015-07-17 ENCOUNTER — Encounter (HOSPITAL_COMMUNITY): Payer: Self-pay | Admitting: *Deleted

## 2015-07-17 DIAGNOSIS — N2581 Secondary hyperparathyroidism of renal origin: Secondary | ICD-10-CM | POA: Insufficient documentation

## 2015-07-17 DIAGNOSIS — E119 Type 2 diabetes mellitus without complications: Secondary | ICD-10-CM | POA: Insufficient documentation

## 2015-07-17 DIAGNOSIS — I129 Hypertensive chronic kidney disease with stage 1 through stage 4 chronic kidney disease, or unspecified chronic kidney disease: Secondary | ICD-10-CM | POA: Insufficient documentation

## 2015-07-17 DIAGNOSIS — Z880 Allergy status to penicillin: Secondary | ICD-10-CM | POA: Insufficient documentation

## 2015-07-17 DIAGNOSIS — Z7982 Long term (current) use of aspirin: Secondary | ICD-10-CM | POA: Insufficient documentation

## 2015-07-17 DIAGNOSIS — Z794 Long term (current) use of insulin: Secondary | ICD-10-CM | POA: Insufficient documentation

## 2015-07-17 DIAGNOSIS — E785 Hyperlipidemia, unspecified: Secondary | ICD-10-CM | POA: Diagnosis not present

## 2015-07-17 DIAGNOSIS — N185 Chronic kidney disease, stage 5: Secondary | ICD-10-CM | POA: Insufficient documentation

## 2015-07-17 HISTORY — PX: AV FISTULA PLACEMENT: SHX1204

## 2015-07-17 HISTORY — DX: Adverse effect of unspecified anesthetic, initial encounter: T41.45XA

## 2015-07-17 HISTORY — DX: Other complications of anesthesia, initial encounter: T88.59XA

## 2015-07-17 LAB — GLUCOSE, CAPILLARY
GLUCOSE-CAPILLARY: 90 mg/dL (ref 65–99)
GLUCOSE-CAPILLARY: 123 mg/dL — AB (ref 65–99)

## 2015-07-17 LAB — POCT I-STAT 4, (NA,K, GLUC, HGB,HCT)
GLUCOSE: 133 mg/dL — AB (ref 65–99)
HCT: 38 % — ABNORMAL LOW (ref 39.0–52.0)
Hemoglobin: 12.9 g/dL — ABNORMAL LOW (ref 13.0–17.0)
POTASSIUM: 4.8 mmol/L (ref 3.5–5.1)
Sodium: 138 mmol/L (ref 135–145)

## 2015-07-17 SURGERY — ARTERIOVENOUS (AV) FISTULA CREATION
Anesthesia: Monitor Anesthesia Care | Site: Arm Upper | Laterality: Left

## 2015-07-17 MED ORDER — VANCOMYCIN HCL IN DEXTROSE 1-5 GM/200ML-% IV SOLN
INTRAVENOUS | Status: AC
  Filled 2015-07-17: qty 200

## 2015-07-17 MED ORDER — VANCOMYCIN HCL IN DEXTROSE 1-5 GM/200ML-% IV SOLN
1000.0000 mg | INTRAVENOUS | Status: AC
  Administered 2015-07-17: 1000 mg via INTRAVENOUS

## 2015-07-17 MED ORDER — SODIUM CHLORIDE 0.9 % IV SOLN
INTRAVENOUS | Status: DC
  Administered 2015-07-17: 10:00:00 via INTRAVENOUS

## 2015-07-17 MED ORDER — FENTANYL CITRATE (PF) 250 MCG/5ML IJ SOLN
INTRAMUSCULAR | Status: AC
  Filled 2015-07-17: qty 5

## 2015-07-17 MED ORDER — OXYCODONE-ACETAMINOPHEN 5-325 MG PO TABS: 1.0000 | ORAL_TABLET | ORAL | Status: DC | PRN

## 2015-07-17 MED ORDER — MIDAZOLAM HCL 5 MG/5ML IJ SOLN
INTRAMUSCULAR | Status: DC | PRN
  Administered 2015-07-17: 2 mg via INTRAVENOUS

## 2015-07-17 MED ORDER — GLYCOPYRROLATE 0.2 MG/ML IJ SOLN
INTRAMUSCULAR | Status: AC
  Filled 2015-07-17: qty 1

## 2015-07-17 MED ORDER — SUCCINYLCHOLINE CHLORIDE 20 MG/ML IJ SOLN
INTRAMUSCULAR | Status: AC
  Filled 2015-07-17: qty 1

## 2015-07-17 MED ORDER — LIDOCAINE HCL (CARDIAC) 20 MG/ML IV SOLN
INTRAVENOUS | Status: AC
  Filled 2015-07-17: qty 5

## 2015-07-17 MED ORDER — ROCURONIUM BROMIDE 50 MG/5ML IV SOLN
INTRAVENOUS | Status: AC
  Filled 2015-07-17: qty 1

## 2015-07-17 MED ORDER — PROPOFOL INFUSION 10 MG/ML OPTIME
INTRAVENOUS | Status: DC | PRN
Start: 2015-07-17 — End: 2015-07-17
  Administered 2015-07-17: 75 ug/kg/min via INTRAVENOUS

## 2015-07-17 MED ORDER — OXYCODONE-ACETAMINOPHEN 5-325 MG PO TABS
ORAL_TABLET | ORAL | Status: AC
  Filled 2015-07-17: qty 1

## 2015-07-17 MED ORDER — LIDOCAINE HCL (CARDIAC) 20 MG/ML IV SOLN
INTRAVENOUS | Status: DC | PRN
  Administered 2015-07-17: 50 mg via INTRAVENOUS

## 2015-07-17 MED ORDER — PHENYLEPHRINE HCL 10 MG/ML IJ SOLN
INTRAMUSCULAR | Status: DC | PRN
  Administered 2015-07-17: 120 ug via INTRAVENOUS
  Administered 2015-07-17: 40 ug via INTRAVENOUS
  Administered 2015-07-17: 80 ug via INTRAVENOUS
  Administered 2015-07-17: 120 ug via INTRAVENOUS

## 2015-07-17 MED ORDER — SODIUM CHLORIDE 0.9 % IJ SOLN
INTRAMUSCULAR | Status: AC
  Filled 2015-07-17: qty 10

## 2015-07-17 MED ORDER — MIDAZOLAM HCL 2 MG/2ML IJ SOLN
INTRAMUSCULAR | Status: AC
  Filled 2015-07-17: qty 4

## 2015-07-17 MED ORDER — SODIUM CHLORIDE 0.9 % IR SOLN
Status: DC | PRN
  Administered 2015-07-17: 500 mL

## 2015-07-17 MED ORDER — PHENYLEPHRINE HCL 10 MG/ML IJ SOLN
10.0000 mg | INTRAVENOUS | Status: DC | PRN
  Administered 2015-07-17: 25 ug/min via INTRAVENOUS

## 2015-07-17 MED ORDER — EPHEDRINE SULFATE 50 MG/ML IJ SOLN
INTRAMUSCULAR | Status: AC
  Filled 2015-07-17: qty 1

## 2015-07-17 MED ORDER — ONDANSETRON HCL 4 MG/2ML IJ SOLN
INTRAMUSCULAR | Status: AC
  Filled 2015-07-17: qty 2

## 2015-07-17 MED ORDER — FENTANYL CITRATE (PF) 100 MCG/2ML IJ SOLN
INTRAMUSCULAR | Status: DC | PRN
  Administered 2015-07-17: 50 ug via INTRAVENOUS

## 2015-07-17 MED ORDER — SODIUM CHLORIDE 0.9 % IV SOLN: INTRAVENOUS | Status: DC

## 2015-07-17 MED ORDER — CHLORHEXIDINE GLUCONATE CLOTH 2 % EX PADS: 6.0000 | MEDICATED_PAD | Freq: Once | CUTANEOUS | Status: DC

## 2015-07-17 MED ORDER — 0.9 % SODIUM CHLORIDE (POUR BTL) OPTIME
TOPICAL | Status: DC | PRN
  Administered 2015-07-17: 1000 mL

## 2015-07-17 MED ORDER — PROPOFOL 10 MG/ML IV BOLUS
INTRAVENOUS | Status: AC
  Filled 2015-07-17: qty 20

## 2015-07-17 MED ORDER — LIDOCAINE-EPINEPHRINE 0.5 %-1:200000 IJ SOLN
INTRAMUSCULAR | Status: DC | PRN
  Administered 2015-07-17: 10 mL

## 2015-07-17 MED ORDER — LIDOCAINE-EPINEPHRINE 0.5 %-1:200000 IJ SOLN
INTRAMUSCULAR | Status: AC
  Filled 2015-07-17: qty 1

## 2015-07-17 MED ORDER — PROPOFOL 10 MG/ML IV BOLUS
INTRAVENOUS | Status: DC | PRN
  Administered 2015-07-17: 30 mg via INTRAVENOUS

## 2015-07-17 MED ORDER — OXYCODONE-ACETAMINOPHEN 5-325 MG PO TABS
1.0000 | ORAL_TABLET | ORAL | Status: AC | PRN
  Administered 2015-07-17: 1 via ORAL

## 2015-07-17 SURGICAL SUPPLY — 29 items
ARMBAND PINK RESTRICT EXTREMIT (MISCELLANEOUS) ×2 IMPLANT
BENZOIN TINCTURE PRP APPL 2/3 (GAUZE/BANDAGES/DRESSINGS) ×2 IMPLANT
CANISTER SUCTION 2500CC (MISCELLANEOUS) ×2 IMPLANT
CANNULA VESSEL 3MM 2 BLNT TIP (CANNULA) IMPLANT
CLIP LIGATING EXTRA MED SLVR (CLIP) ×2 IMPLANT
CLIP LIGATING EXTRA SM BLUE (MISCELLANEOUS) ×2 IMPLANT
COVER PROBE W GEL 5X96 (DRAPES) ×2 IMPLANT
DECANTER SPIKE VIAL GLASS SM (MISCELLANEOUS) ×2 IMPLANT
ELECT REM PT RETURN 9FT ADLT (ELECTROSURGICAL) ×2
ELECTRODE REM PT RTRN 9FT ADLT (ELECTROSURGICAL) ×1 IMPLANT
GAUZE SPONGE 4X4 12PLY STRL (GAUZE/BANDAGES/DRESSINGS) ×2 IMPLANT
GEL ULTRASOUND 20GR AQUASONIC (MISCELLANEOUS) IMPLANT
GLOVE SS BIOGEL STRL SZ 7.5 (GLOVE) ×1 IMPLANT
GLOVE SUPERSENSE BIOGEL SZ 7.5 (GLOVE) ×1
GOWN STRL REUS W/ TWL LRG LVL3 (GOWN DISPOSABLE) ×3 IMPLANT
GOWN STRL REUS W/TWL LRG LVL3 (GOWN DISPOSABLE) ×3
KIT BASIN OR (CUSTOM PROCEDURE TRAY) ×2 IMPLANT
KIT ROOM TURNOVER OR (KITS) ×2 IMPLANT
NS IRRIG 1000ML POUR BTL (IV SOLUTION) ×2 IMPLANT
PACK CV ACCESS (CUSTOM PROCEDURE TRAY) ×2 IMPLANT
PAD ARMBOARD 7.5X6 YLW CONV (MISCELLANEOUS) ×4 IMPLANT
SPONGE GAUZE 4X4 12PLY STER LF (GAUZE/BANDAGES/DRESSINGS) ×2 IMPLANT
STRIP CLOSURE SKIN 1/2X4 (GAUZE/BANDAGES/DRESSINGS) ×2 IMPLANT
SUT PROLENE 6 0 CC (SUTURE) ×2 IMPLANT
SUT VIC AB 3-0 SH 27 (SUTURE) ×1
SUT VIC AB 3-0 SH 27X BRD (SUTURE) ×1 IMPLANT
TAPE CLOTH SURG 4X10 WHT LF (GAUZE/BANDAGES/DRESSINGS) ×2 IMPLANT
UNDERPAD 30X30 INCONTINENT (UNDERPADS AND DIAPERS) ×2 IMPLANT
WATER STERILE IRR 1000ML POUR (IV SOLUTION) ×2 IMPLANT

## 2015-07-17 NOTE — Transfer of Care (Signed)
Immediate Anesthesia Transfer of Care Note  Patient: Donald Alvarez  Procedure(s) Performed: Procedure(s): Left Upper Arm  ARTERIOVENOUS FISTULA CREATION (Left)  Patient Location: PACU  Anesthesia Type:MAC  Level of Consciousness: awake, oriented and patient cooperative  Airway & Oxygen Therapy: Patient Spontanous Breathing and Patient connected to face mask oxygen  Post-op Assessment: Report given to RN and Post -op Vital signs reviewed and stable  Post vital signs: Reviewed  Last Vitals:  Filed Vitals:   07/17/15 0948  BP: 147/98  Pulse: 80  Temp: 36.9 C  Resp: 20    Complications: No apparent anesthesia complications

## 2015-07-17 NOTE — Anesthesia Procedure Notes (Signed)
Procedure Name: MAC Date/Time: 07/17/2015 1:20 PM Performed by: Jenne Campus Pre-anesthesia Checklist: Patient identified, Emergency Drugs available, Suction available, Patient being monitored and Timeout performed Patient Re-evaluated:Patient Re-evaluated prior to inductionOxygen Delivery Method: Simple face mask

## 2015-07-17 NOTE — H&P (Signed)
Progress Notes   Donald Alvarez (MR# [id number])      Progress Notes Info    Author Note Status Last Update User Last Update Date/Time   Ulyses Amor, PA-C Signed Ulyses Amor, PA-C 05/04/2015 2:56 PM    Progress Notes    Expand All Collapse All      Brief History and Physical  History of Present Illness  Donald Alvarez is a 49 y.o. male who presents with chief complaint: CKD stage IV-V . The patient presents today for follow up discussion about proceeding with AV fistula creation. He was seen by Dr. Donnetta Hutching back in February 2016 at that time he was not and still is not on HD. He reports no medical changes since his last visit. Dr. Vanetta Mulders has told him he will need HD soon.   Past Medical History  Diagnosis Date  . Diabetes mellitus without complication   . Hypertension   . Renal disorder   . HLD (hyperlipidemia)   . Gout   . Anemia   . Hyperparathyroidism due to renal insufficiency     Past Surgical History  Procedure Laterality Date  . No past surgeries      History   Social History  . Marital Status: Married    Spouse Name: N/A  . Number of Children: N/A  . Years of Education: N/A   Occupational History  . Not on file.   Social History Main Topics  . Smoking status: Never Smoker   . Smokeless tobacco: Never Used  . Alcohol Use: No  . Drug Use: No  . Sexual Activity: Not on file   Other Topics Concern  . Not on file   Social History Narrative    Family History  Problem Relation Age of Onset  . Diabetes Mellitus II Mother   . Kidney disease Mother   . Diabetes Mother   . Hypertension Mother   . CAD Father   . Heart disease Father   . Hypertension Father     Current Outpatient Prescriptions on File Prior to Visit  Medication Sig Dispense Refill  . amLODipine (NORVASC) 5 MG tablet Take 10 mg by  mouth daily.     Donald Alvarez Kitchen aspirin 81 MG tablet Take 81 mg by mouth daily.    Donald Alvarez Kitchen atorvastatin (LIPITOR) 40 MG tablet Take 40 mg by mouth daily.    . carvedilol (COREG) 6.25 MG tablet Take 6.25 mg by mouth 2 (two) times daily with a meal.    . Cholecalciferol (VITAMIN D3) 3000 UNITS TABS Take 1 tablet by mouth daily.    . nateglinide (STARLIX) 120 MG tablet Take 120 mg by mouth 3 (three) times daily with meals.    . colchicine 0.6 MG tablet Take 1 tablet (0.6 mg total) by mouth daily. (Patient not taking: Reported on 01/03/2015) 30 tablet 0  . insulin glargine (LANTUS) 100 UNIT/ML injection Inject 0.12 mLs (12 Units total) into the skin daily. At 10 am (Patient not taking: Reported on 01/03/2015) 10 mL 11  . predniSONE (DELTASONE) 10 MG tablet Take 3 tablets (30 mg) daily for 2 days, then, Take 20 tablets (20 mg) daily for 2 days, then, Take 1 tablets (10 mg) daily for 2 days, then stop (Patient not taking: Reported on 01/03/2015) 12 tablet 0   No current facility-administered medications on file prior to visit.    Allergies  Allergen Reactions  . Fenofibrate   . Lisinopril Hives  . Penicillins Hives  . Ultrasound Gel Itching and  Rash    Review of Systems: As listed above, otherwise negative.  Physical Examination  Filed Vitals:   05/04/15 1341  BP: 132/89  Pulse: 66  Height: 6\' 2"  (1.88 m)  Weight: 179 lb (81.194 kg)  SpO2: 98%    General: A&O x 3, WDWN  Pulmonary: Sym exp, good air movt, CTAB, no rales, rhonchi, & wheezing  Cardiac: RRR, Nl S1, S2, no Murmurs, rubs or gallops  Gastrointestinal: soft, NTND, -G/R, - HSM, - masses, - CVAT B  Musculoskeletal: M/S 5/5 throughout , Extremities without ischemic changes  Vascular palpable radial pulses bilaterally  Laboratory See Cumberland is a 49 y.o. male who presents with: ESRD not currently on HD. He is right  handed and he wishes to have a left upper arm AV fistula creation he doesn't want a fore arm graft. The cephalic vein bilaterally looks very acceptable. He understands the benefits and risk of the procedure. He is going to call us to schedule his procedure in July.   Donald Alvarez EMMA [name] [facility] [place] Office: [phone] Pager: [phone]  05/04/2015, 1:46 PM          Addendum:  The patient has been re-examined and re-evaluated.  The patient's history and physical has been reviewed and is unchanged.    Donald Alvarez is a 49 y.o. male is being admitted with End Stage Renal Disease N18.6. All the risks, benefits and other treatment options have been discussed with the patient. The patient has consented to proceed with Procedure(s): ARTERIOVENOUS (AV) FISTULA CREATION as a surgical intervention.  Curt Jews 07/17/2015 12:25 PM [facility]

## 2015-07-17 NOTE — Op Note (Signed)
    OPERATIVE REPORT  DATE OF SURGERY: 07/17/2015  PATIENT: Donald Alvarez, 49 y.o. male MRN: [id number]  DOB: [birth year]  PRE-OPERATIVE DIAGNOSIS: chronic renal insufficiency  POST-OPERATIVE DIAGNOSIS:  Same  PROCEDURE: left upper arm brachiocephalic AV fistula creation  SURGEON:  Curt Jews, M.D.  PHYSICIAN ASSISTANT: nurse  ANESTHESIA:  Local with sedation  EBL: minimal ml  Total I/O In: 200 [I.V.:200] Out: -   BLOOD ADMINISTERED: none  DRAINS: none  SPECIMEN: none  COUNTS CORRECT:  YES  PLAN OF CARE: PACU   PATIENT DISPOSITION:  PACU - hemodynamically stable  PROCEDURE DETAILS: Patient was taken to the operating placed supine position where the area of the left arm prepped draped in sterile fashion. Incision was made over the antecubital space and carried down to isolate the cephalic and basilic veins at the antecubital space. The patient had a configuration with a communicating vein between the 2 of these. The cephalic vein was mobilized distally and was ligated and divided. This was gently dilated and the cephalic vein was felt to be adequate caliber for attempted fistula. The communicating branch was ligated with 3-0 silk ties and divided. This left the basilic vein intact. The cephalic vein was mobilized to the level of the brachial artery. The brachial artery was of good caliber and no atherosclerotic change. The artery was occluded proximally and distally was opened with 11 blade some alternating with Potts scissors. The cephalic vein was spatulated and sewn end-to-side to the artery with a running 6-0 chromic suture. Clamps removed and good thrill was noted. The wound irrigated with saline. Hemostasis tablet cautery. Wounds were closed with 30 by: Subtenon subcutaneous tissue. Benzoin Steri-Strips were applied. The patient was transferred to the recovery room stable condition   Curt Jews, M.D. 07/17/2015 2:29 PM

## 2015-07-17 NOTE — Anesthesia Preprocedure Evaluation (Addendum)
Anesthesia Evaluation  Patient identified by MRN, date of birth, ID band Patient awake    Reviewed: Allergy & Precautions, NPO status , Patient's Chart, lab work & pertinent test results  Airway Mallampati: II  TM Distance: >3 FB Neck ROM: Full    Dental  (+) Teeth Intact   Pulmonary neg pulmonary ROS,  breath sounds clear to auscultation        Cardiovascular hypertension, Pt. on medications Rhythm:Regular Rate:Normal     Neuro/Psych negative neurological ROS  negative psych ROS   GI/Hepatic negative GI ROS, Neg liver ROS,   Endo/Other  diabetes, Type 1, Insulin Dependent  Renal/GU Renal disease  negative genitourinary   Musculoskeletal negative musculoskeletal ROS (+)   Abdominal   Peds negative pediatric ROS (+)  Hematology negative hematology ROS (+)   Anesthesia Other Findings   Reproductive/Obstetrics negative OB ROS                            Lab Results  Component Value Date   WBC 8.4 05/20/2014   HGB 12.9* 07/17/2015   HCT 38.0* 07/17/2015   MCV 74.3* 05/20/2014   PLT 183 05/20/2014   Lab Results  Component Value Date   CREATININE 4.77* 05/21/2014   BUN 61* 05/21/2014   NA 138 07/17/2015   K 4.8 07/17/2015   CL 100 05/21/2014   CO2 15* 05/21/2014   No results found for: INR, PROTIME  EKG: normal sinus rhythm.   Anesthesia Physical Anesthesia Plan  ASA: III  Anesthesia Plan: MAC   Post-op Pain Management:    Induction: Intravenous  Airway Management Planned: Natural Airway and Simple Face Mask  Additional Equipment:   Intra-op Plan:   Post-operative Plan:   Informed Consent: I have reviewed the patients History and Physical, chart, labs and discussed the procedure including the risks, benefits and alternatives for the proposed anesthesia with the patient or authorized representative who has indicated his/her understanding and acceptance.   Dental  advisory given  Plan Discussed with: CRNA  Anesthesia Plan Comments:         Anesthesia Quick Evaluation

## 2015-07-17 NOTE — Anesthesia Postprocedure Evaluation (Signed)
  Anesthesia Post-op Note  Patient: Donald Alvarez  Procedure(s) Performed: Procedure(s): Left Upper Arm  ARTERIOVENOUS FISTULA CREATION (Left)  Patient Location: PACU  Anesthesia Type:MAC  Level of Consciousness: awake, alert  and oriented  Airway and Oxygen Therapy: Patient Spontanous Breathing  Post-op Pain: minimal  Post-op Assessment: Post-op Vital signs reviewed and Patient's Cardiovascular Status Stable              Post-op Vital Signs: Reviewed and stable  Last Vitals:  Filed Vitals:   07/17/15 1515  BP: 120/84  Pulse: 65  Temp: 36.4 C  Resp: 11    Complications: No apparent anesthesia complications

## 2015-07-18 ENCOUNTER — Encounter (HOSPITAL_COMMUNITY): Payer: Self-pay | Admitting: Vascular Surgery

## 2015-07-21 ENCOUNTER — Telehealth: Payer: Self-pay | Admitting: Vascular Surgery

## 2015-07-21 NOTE — Telephone Encounter (Addendum)
-----   Message from Mena Goes, RN sent at 07/17/2015  4:08 PM EDT ----- Regarding: Schedule   ----- Message -----    From: [name], MD    Sent: 07/17/2015   3:17 PM      To: [facility] Charge Pool  Had left upper arm AVfistula.  asst nurse.  Needs office visit, no lab in4-6 weeks   notified patient of post op appt. on 08-22-15 at 11:30 am

## 2015-08-16 ENCOUNTER — Ambulatory Visit (HOSPITAL_COMMUNITY)
Admission: RE | Admit: 2015-08-16 | Discharge: 2015-08-16 | Disposition: A | Discharge status: Home / Self Care | Payer: BLUE CROSS/BLUE SHIELD | Source: Ambulatory Visit | Attending: Nephrology | Admitting: Nephrology

## 2015-08-16 DIAGNOSIS — D509 Iron deficiency anemia, unspecified: Secondary | ICD-10-CM | POA: Diagnosis not present

## 2015-08-16 MED ORDER — SODIUM CHLORIDE 0.9 % IV SOLN
510.0000 mg | INTRAVENOUS | Status: DC
  Administered 2015-08-16: 510 mg via INTRAVENOUS
  Filled 2015-08-16: qty 17

## 2015-08-18 ENCOUNTER — Encounter: Payer: Self-pay | Admitting: Vascular Surgery

## 2015-08-22 ENCOUNTER — Encounter: Payer: Self-pay | Admitting: Vascular Surgery

## 2015-08-22 ENCOUNTER — Ambulatory Visit (INDEPENDENT_AMBULATORY_CARE_PROVIDER_SITE_OTHER): Payer: BLUE CROSS/BLUE SHIELD | Admitting: Vascular Surgery

## 2015-08-22 VITALS — BP 136/87 | HR 81 | Temp 97.5°F | Resp 16 | Ht 74.0 in | Wt 172.0 lb

## 2015-08-22 DIAGNOSIS — N184 Chronic kidney disease, stage 4 (severe): Secondary | ICD-10-CM

## 2015-08-22 NOTE — Progress Notes (Signed)
Patient presents today for follow-up of upper arm AV fistula creation by myself on 07/17/2015. He has stage IV chronic renal insufficiency and is not on hemodialysis currently. He denies any steal symptoms.  On physical exam he does have a 2+ left radial pulse. His antecubital incision is healed quite nicely. He has an excellent upper arm thrill and good Tommy Goostree maturation of his cephalic vein fistula. No large side branches noted  Impression and plan stable one month follow-up after left upper arm AV fistula creation. He will continue exercising his fistula. We'll plan on seeing [place] again as needed. Should have the high likelihood of having a usable fistula if he continues to have maturation as has been done for the first month.

## 2016-07-09 ENCOUNTER — Encounter (HOSPITAL_COMMUNITY): Payer: Self-pay

## 2016-07-09 ENCOUNTER — Observation Stay (HOSPITAL_COMMUNITY)
Admission: EM | Admit: 2016-07-09 | Discharge: 2016-07-10 | Disposition: A | Discharge status: Home / Self Care | Payer: BLUE CROSS/BLUE SHIELD | Attending: Internal Medicine | Admitting: Internal Medicine

## 2016-07-09 DIAGNOSIS — I120 Hypertensive chronic kidney disease with stage 5 chronic kidney disease or end stage renal disease: Principal | ICD-10-CM | POA: Insufficient documentation

## 2016-07-09 DIAGNOSIS — I10 Essential (primary) hypertension: Secondary | ICD-10-CM | POA: Diagnosis present

## 2016-07-09 DIAGNOSIS — Q612 Polycystic kidney, adult type: Secondary | ICD-10-CM

## 2016-07-09 DIAGNOSIS — M109 Gout, unspecified: Secondary | ICD-10-CM | POA: Diagnosis present

## 2016-07-09 DIAGNOSIS — Z992 Dependence on renal dialysis: Secondary | ICD-10-CM | POA: Insufficient documentation

## 2016-07-09 DIAGNOSIS — Z794 Long term (current) use of insulin: Secondary | ICD-10-CM | POA: Insufficient documentation

## 2016-07-09 DIAGNOSIS — Z7982 Long term (current) use of aspirin: Secondary | ICD-10-CM | POA: Insufficient documentation

## 2016-07-09 DIAGNOSIS — E785 Hyperlipidemia, unspecified: Secondary | ICD-10-CM | POA: Diagnosis present

## 2016-07-09 DIAGNOSIS — E1140 Type 2 diabetes mellitus with diabetic neuropathy, unspecified: Secondary | ICD-10-CM | POA: Diagnosis present

## 2016-07-09 DIAGNOSIS — N19 Unspecified kidney failure: Secondary | ICD-10-CM | POA: Diagnosis present

## 2016-07-09 DIAGNOSIS — E119 Type 2 diabetes mellitus without complications: Secondary | ICD-10-CM

## 2016-07-09 DIAGNOSIS — N186 End stage renal disease: Secondary | ICD-10-CM | POA: Diagnosis not present

## 2016-07-09 HISTORY — DX: End stage renal disease: N18.6

## 2016-07-09 HISTORY — DX: End stage renal disease: Z99.2

## 2016-07-09 LAB — CBC WITH DIFFERENTIAL/PLATELET
BASOS ABS: 0 10*3/uL (ref 0.0–0.1)
BASOS PCT: 0 %
EOS ABS: 0.3 10*3/uL (ref 0.0–0.7)
EOS PCT: 3 %
HEMATOCRIT: 31.7 % — AB (ref 39.0–52.0)
HEMOGLOBIN: 10.2 g/dL — AB (ref 13.0–17.0)
Lymphocytes Relative: 15 %
Lymphs Abs: 1.6 10*3/uL (ref 0.7–4.0)
MCH: 24.7 pg — ABNORMAL LOW (ref 26.0–34.0)
MCHC: 32.2 g/dL (ref 30.0–36.0)
MCV: 76.8 fL — ABNORMAL LOW (ref 78.0–100.0)
MONO ABS: 0.6 10*3/uL (ref 0.1–1.0)
MONOS PCT: 6 %
Neutro Abs: 8.1 10*3/uL — ABNORMAL HIGH (ref 1.7–7.7)
Neutrophils Relative %: 76 %
Platelets: 211 10*3/uL (ref 150–400)
RBC: 4.13 MIL/uL — AB (ref 4.22–5.81)
RDW: 15.1 % (ref 11.5–15.5)
WBC: 10.6 10*3/uL — ABNORMAL HIGH (ref 4.0–10.5)

## 2016-07-09 LAB — BASIC METABOLIC PANEL
ANION GAP: 14 (ref 5–15)
BUN: 150 mg/dL — ABNORMAL HIGH (ref 6–20)
CHLORIDE: 102 mmol/L (ref 101–111)
CO2: 16 mmol/L — AB (ref 22–32)
Calcium: 9 mg/dL (ref 8.9–10.3)
Creatinine, Ser: 17.21 mg/dL — ABNORMAL HIGH (ref 0.61–1.24)
GFR calc non Af Amer: 3 mL/min — ABNORMAL LOW (ref >60)
GFR, EST AFRICAN AMERICAN: 3 mL/min — AB (ref >60)
Glucose, Bld: 108 mg/dL — ABNORMAL HIGH (ref 65–99)
POTASSIUM: 5.1 mmol/L (ref 3.5–5.1)
SODIUM: 132 mmol/L — AB (ref 135–145)

## 2016-07-09 LAB — MRSA PCR SCREENING: MRSA by PCR: NEGATIVE

## 2016-07-09 LAB — GLUCOSE, CAPILLARY: Glucose-Capillary: 74 mg/dL (ref 65–99)

## 2016-07-09 MED ORDER — FOLTANX 3-35-2 MG PO TABS: 1.0000 | ORAL_TABLET | Freq: Every day | ORAL | Status: DC

## 2016-07-09 MED ORDER — INSULIN GLARGINE 100 UNIT/ML ~~LOC~~ SOLN: 8.0000 [IU] | Freq: Every day | SUBCUTANEOUS | Status: DC

## 2016-07-09 MED ORDER — FA-PYRIDOXINE-CYANOCOBALAMIN 2.5-25-2 MG PO TABS
1.0000 | ORAL_TABLET | Freq: Every day | ORAL | Status: DC
  Filled 2016-07-09: qty 1

## 2016-07-09 MED ORDER — AMLODIPINE BESYLATE 10 MG PO TABS: 10.0000 mg | ORAL_TABLET | Freq: Every day | ORAL | Status: DC

## 2016-07-09 MED ORDER — INSULIN GLARGINE 100 UNIT/ML ~~LOC~~ SOLN: 6.0000 [IU] | Freq: Every day | SUBCUTANEOUS | Status: DC

## 2016-07-09 MED ORDER — ACETAMINOPHEN 650 MG RE SUPP: 650.0000 mg | Freq: Four times a day (QID) | RECTAL | Status: DC | PRN

## 2016-07-09 MED ORDER — ONDANSETRON HCL 4 MG PO TABS: 4.0000 mg | ORAL_TABLET | Freq: Four times a day (QID) | ORAL | Status: DC | PRN

## 2016-07-09 MED ORDER — INSULIN GLARGINE 100 UNIT/ML ~~LOC~~ SOLN
4.0000 [IU] | Freq: Every day | SUBCUTANEOUS | Status: DC
  Filled 2016-07-09: qty 0.04

## 2016-07-09 MED ORDER — VITAMIN D 1000 UNITS PO TABS: 3000.0000 [IU] | ORAL_TABLET | Freq: Every day | ORAL | Status: DC

## 2016-07-09 MED ORDER — ATORVASTATIN CALCIUM 40 MG PO TABS: 40.0000 mg | ORAL_TABLET | Freq: Every day | ORAL | Status: DC

## 2016-07-09 MED ORDER — COLCHICINE 0.6 MG PO TABS: 0.6000 mg | ORAL_TABLET | Freq: Every day | ORAL | Status: DC

## 2016-07-09 MED ORDER — PYRIDOXINE HCL 25 MG PO TABS
25.0000 mg | ORAL_TABLET | Freq: Every day | ORAL | Status: DC
  Administered 2016-07-09: 25 mg via ORAL
  Filled 2016-07-09 (×2): qty 1

## 2016-07-09 MED ORDER — MUPIROCIN 2 % EX OINT
1.0000 "application " | TOPICAL_OINTMENT | Freq: Three times a day (TID) | CUTANEOUS | Status: DC
  Administered 2016-07-09: 1 via NASAL
  Filled 2016-07-09: qty 22

## 2016-07-09 MED ORDER — HEPARIN SODIUM (PORCINE) 5000 UNIT/ML IJ SOLN
5000.0000 [IU] | Freq: Three times a day (TID) | INTRAMUSCULAR | Status: DC
  Filled 2016-07-09: qty 1

## 2016-07-09 MED ORDER — ONDANSETRON HCL 4 MG/2ML IJ SOLN: 4.0000 mg | Freq: Four times a day (QID) | INTRAMUSCULAR | Status: DC | PRN

## 2016-07-09 MED ORDER — ACETAMINOPHEN 325 MG PO TABS: 650.0000 mg | ORAL_TABLET | Freq: Four times a day (QID) | ORAL | Status: DC | PRN

## 2016-07-09 MED ORDER — SODIUM CHLORIDE 0.9% FLUSH
3.0000 mL | Freq: Two times a day (BID) | INTRAVENOUS | Status: DC
  Administered 2016-07-09: 3 mL via INTRAVENOUS

## 2016-07-09 MED ORDER — CARVEDILOL 6.25 MG PO TABS
6.2500 mg | ORAL_TABLET | Freq: Two times a day (BID) | ORAL | Status: DC
  Administered 2016-07-10: 6.25 mg via ORAL
  Filled 2016-07-09: qty 1

## 2016-07-09 MED ORDER — INSULIN ASPART 100 UNIT/ML ~~LOC~~ SOLN: 0.0000 [IU] | Freq: Three times a day (TID) | SUBCUTANEOUS | Status: DC

## 2016-07-09 MED ORDER — ASPIRIN 81 MG PO CHEW: 81.0000 mg | CHEWABLE_TABLET | Freq: Every day | ORAL | Status: DC

## 2016-07-09 NOTE — ED Triage Notes (Signed)
Pt sent from [facility], pt missed several treatments and was sent here for dialysis.  Last treatment 06-21-16.

## 2016-07-09 NOTE — ED Notes (Signed)
Pt has stepped away for a few mins

## 2016-07-09 NOTE — ED Provider Notes (Addendum)
[place] DEPT Provider Note   CSN: [id number] Arrival date & time: 07/09/16  1245     History   Chief Complaint Chief Complaint  Patient presents with  . Vascular Access Problem    HPI Donald Alvarez is a 50 y.o. male.  HPI Pt comes in with cc of missed HD. Pt;s last HD was on Aug 4. Pt works 6 days a week, he is busy, and doesn't think he needs HD as often as prescribed - so he hasnt had HD the past few days. Pt has no complains. States that he went to his HD center, and was asked to come to the ER. No dib, palpitations, chest pain, confusion.  Past Medical History:  Diagnosis Date  . Anemia   . Complication of anesthesia    Pt stated that his blood preesure must be monitored closely during any surgical procedure ( per neurologist)  . Diabetes mellitus without complication ([place])   . ESRD on dialysis ([place])   . Gout   . HLD (hyperlipidemia)   . Hyperparathyroidism due to renal insufficiency ([place])   . Hypertension   . Renal disorder     Patient Active Problem List   Diagnosis Date Noted  . Gout 07/09/2016  . HLD (hyperlipidemia) 07/09/2016  . ESRD on dialysis ([place])   . Uremia   . Bilateral edema of lower extremity 05/20/2014  . HTN (hypertension) 05/20/2014  . Diabetes mellitus without complication ([place]) [id number]  . Anemia 05/20/2014  . Acute renal failure ([place]) 05/20/2014  . Polycystic kidney disease, autosomal dominant 05/20/2014  . Gout flare 05/20/2014  . Renal failure 05/19/2014  . Neuropathy in diabetes ([place]) 11/23/2013  . Plantar fasciitis of left foot 11/23/2013  . Callus of foot 11/23/2013  . Porokeratosis 11/23/2013    Past Surgical History:  Procedure Laterality Date  . AV FISTULA PLACEMENT Left 07/17/2015   Procedure: Left Upper Arm  ARTERIOVENOUS FISTULA CREATION;  Surgeon: [name], MD;  Location: [facility] OR;  Service: Vascular;  Laterality: Left;  . NO PAST SURGERIES         Home Medications    Prior to Admission medications     Medication Sig Start Date End Date Taking? Authorizing Provider  amLODipine (NORVASC) 10 MG tablet Take 10 mg by mouth daily.    Historical Provider, MD  aspirin 81 MG tablet Take 81 mg by mouth daily.    Historical Provider, MD  atorvastatin (LIPITOR) 40 MG tablet Take 40 mg by mouth daily.    Historical Provider, MD  carvedilol (COREG) 6.25 MG tablet Take 6.25 mg by mouth 2 (two) times daily with a meal.    Historical Provider, MD  Cholecalciferol (VITAMIN D3) 3000 UNITS TABS Take 1 tablet by mouth daily.    Historical Provider, MD  clindamycin (CLEOCIN) 150 MG capsule Take 150 mg by mouth 4 (four) times daily.    Historical Provider, MD  colchicine 0.6 MG tablet Take 1 tablet (0.6 mg total) by mouth daily. 05/21/14   Shanker Kristeen Mans, MD  insulin glargine (LANTUS) 100 UNIT/ML injection Inject 0.12 mLs (12 Units total) into the skin daily. At 10 am 05/21/14   Jonetta Osgood, MD  L-Methylfolate-B6-B12 (FOLTANX PO) Take 1 tablet by mouth daily.    Historical Provider, MD  mupirocin ointment (BACTROBAN) 2 % Place 1 application into the nose 3 (three) times daily.    Historical Provider, MD  nateglinide (STARLIX) 120 MG tablet Take 120 mg by mouth 3 (three) times daily with  meals.    Historical Provider, MD  oxyCODONE-acetaminophen (ROXICET) 5-325 MG per tablet Take 1 tablet by mouth every 4 (four) hours as needed for severe pain. Patient not taking: Reported on 08/22/2015 07/17/15   [name], MD  predniSONE (DELTASONE) 10 MG tablet Take 3 tablets (30 mg) daily for 2 days, then, Take 20 tablets (20 mg) daily for 2 days, then, Take 1 tablets (10 mg) daily for 2 days, then stop Patient not taking: Reported on 01/03/2015 05/21/14   Jonetta Osgood, MD  Pyridoxine HCl (VITAMIN B-6 PO) Take 1 tablet by mouth daily.    Historical Provider, MD    Family History Family History  Problem Relation Age of Onset  . Diabetes Mellitus II Mother   . Kidney disease Mother   . Diabetes Mother   .  Hypertension Mother   . CAD Father   . Heart disease Father   . Hypertension Father     Social History Social History  Substance Use Topics  . Smoking status: Never Smoker  . Smokeless tobacco: Never Used  . Alcohol use No     Allergies   Lisinopril; Penicillins; and Ultrasound gel   Review of Systems Review of Systems  ROS 10 Systems reviewed and are negative for acute change except as noted in the HPI.     Physical Exam Updated Vital Signs BP 173/100 (BP Location: Left Arm)   Pulse 71   Temp 97.8 F (36.6 C) (Oral)   Resp 18   Ht 6\' 3"  (1.905 m)   Wt 172 lb 4.8 oz (78.2 kg)   SpO2 100%   BMI 21.54 kg/m   Physical Exam  Constitutional: He is oriented to person, place, and time. He appears well-developed.  HENT:  Head: Atraumatic.  Eyes: EOM are normal.  Neck: Neck supple.  Cardiovascular: Normal rate, regular rhythm and normal heart sounds.   Pulmonary/Chest: Effort normal and breath sounds normal. No respiratory distress. He has no wheezes.  Abdominal: Soft. Bowel sounds are normal.  Neurological: He is alert and oriented to person, place, and time.  Skin: Skin is warm.  Nursing note and vitals reviewed.    ED Treatments / Results  Labs (all labs ordered are listed, but only abnormal results are displayed) Labs Reviewed  CBC WITH DIFFERENTIAL/PLATELET - Abnormal; Notable for the following:       Result Value   WBC 10.6 (*)    RBC 4.13 (*)    Hemoglobin 10.2 (*)    HCT 31.7 (*)    MCV 76.8 (*)    MCH 24.7 (*)    Neutro Abs 8.1 (*)    All other components within normal limits  BASIC METABOLIC PANEL - Abnormal; Notable for the following:    Sodium 132 (*)    CO2 16 (*)    Glucose, Bld 108 (*)    BUN 150 (*)    Creatinine, Ser 17.21 (*)    GFR calc non Af Amer 3 (*)    GFR calc Af Amer 3 (*)    All other components within normal limits  BASIC METABOLIC PANEL  CBC  HEMOGLOBIN A1C  LIPID PANEL    EKG  EKG Interpretation None     ED  ECG REPORT   Date: 07/09/2016  Rate: 76  Rhythm: normal sinus rhythm  QRS Axis: normal  Intervals: normal  ST/T Wave abnormalities: nonspecific ST/T changes  Conduction Disutrbances:none  Narrative Interpretation:   Old EKG Reviewed: none available  I  have personally reviewed the EKG tracing and agree with the computerized printout as noted.   Radiology No results found.  Procedures Procedures (including critical care time)  Medications Ordered in ED Medications  amLODipine (NORVASC) tablet 10 mg (not administered)  FOLTANX 3-35-2 MG TABS 1 tablet (not administered)  mupirocin ointment (BACTROBAN) 2 % 1 application (not administered)  pyridOXINE (VITAMIN B-6) tablet 25 mg (not administered)  aspirin tablet 81 mg (not administered)  atorvastatin (LIPITOR) tablet 40 mg (not administered)  carvedilol (COREG) tablet 6.25 mg (not administered)  Vitamin D3 TABS 1 tablet (not administered)  colchicine tablet 0.6 mg (not administered)  insulin aspart (novoLOG) injection 0-9 Units (not administered)  heparin injection 5,000 Units (not administered)  sodium chloride flush (NS) 0.9 % injection 3 mL (not administered)  acetaminophen (TYLENOL) tablet 650 mg (not administered)  ondansetron (ZOFRAN) tablet 4 mg (not administered)    Or  ondansetron (ZOFRAN) injection 4 mg (not administered)  insulin glargine (LANTUS) injection 6 Units (not administered)     Initial Impression / Assessment and Plan / ED Course  I have reviewed the triage vital signs and the nursing notes.  Pertinent labs & imaging results that were available during my care of the patient were reviewed by me and considered in my medical decision making (see chart for details).  Clinical Course    Pt comes in for "catchup" HD essentially. He has uremia and likely metabolic acidosis.  No significant hyperK. Medicine to admit, nephro requesting obs admission for now.  Final Clinical Impressions(s) / ED Diagnoses     Final diagnoses:  ESRD (end stage renal disease) ([place])  Uremia  ESRD on dialysis [facility])    New Prescriptions New Prescriptions   No medications on file     Varney Biles, MD 07/09/16 1932    Varney Biles, MD 07/09/16 2000

## 2016-07-09 NOTE — ED Notes (Signed)
Attempted to call report x2

## 2016-07-09 NOTE — H&P (Addendum)
History and Physical    Donald Alvarez [id number] DOB: Nov 05, 1966 DOA: 07/09/2016  Referring MD/NP/PA:   PCP: [name], MD   Patient coming from:  The patient is coming from home.  At baseline, pt is independent for most of ADL.  Chief Complaint: Cramps  HPI: Donald Alvarez is a 50 y.o. male with medical history significant of hypertension, hyperlipidemia, diabetes mellitus, gout, anemia, ESRD-HD, who presents with cramps.  Patient states that he is supposed to do hemodialysis on Monday/Wednesday/Friday, but he missed his hemodialysis since 06/21/16 due to busy work schedule. He starts having cramps in his neck and bilateral calf areas. He states that he can feel "toxins build up" and knows he needs dialysis treatment at this point. He went to [facility], but was sent to ED forb dialysis. Patient denies nausea, vomiting, diarrhea, abdominal pain. No symptoms of UTI. Denies chest pain, shortness of breath, cough, fever or chills. No unilateral weakness. No leg edema.  ED Course: pt was found to have  creatinine 17.21, BUN 150, bicarbonate 16, potassium 5.1, WBC 10.6, hemoglobin 10.2, temperature normal, no tachycardia, no tachypnea, oxygen saturation 100% on room air. Patient is placed on telemetry bed for observation. Renal was consulted by EDP.  Review of Systems:   General: no fevers, chills, no changes in body weight, has fatigue HEENT: no blurry vision, hearing changes or sore throat Respiratory: no dyspnea, coughing, wheezing CV: no chest pain, no palpitations GI: no nausea, vomiting, abdominal pain, diarrhea, constipation GU: no dysuria, burning on urination, increased urinary frequency, hematuria  Ext: no leg edema Neuro: no unilateral weakness, numbness, or tingling, no vision change or hearing loss Skin: no rash MSK: has muscle cramps, no deformity, no limitation of range of movement in spin Heme: No easy bruising.  Travel history: No recent long  distant travel.  Allergy:  Allergies  Allergen Reactions  . Lisinopril Hives  . Penicillins Hives  . Ultrasound Gel Itching and Rash    Past Medical History:  Diagnosis Date  . Anemia   . Complication of anesthesia    Pt stated that his blood preesure must be monitored closely during any surgical procedure ( per neurologist)  . Diabetes mellitus without complication ([place])   . ESRD on dialysis ([place])   . Gout   . HLD (hyperlipidemia)   . Hyperparathyroidism due to renal insufficiency ([place])   . Hypertension   . Renal disorder     Past Surgical History:  Procedure Laterality Date  . AV FISTULA PLACEMENT Left 07/17/2015   Procedure: Left Upper Arm  ARTERIOVENOUS FISTULA CREATION;  Surgeon: [name], MD;  Location: [facility] OR;  Service: Vascular;  Laterality: Left;  . NO PAST SURGERIES      Social History:  reports that he has never smoked. He has never used smokeless tobacco. He reports that he does not drink alcohol or use drugs.  Family History:  Family History  Problem Relation Age of Onset  . Diabetes Mellitus II Mother   . Kidney disease Mother   . Diabetes Mother   . Hypertension Mother   . CAD Father   . Heart disease Father   . Hypertension Father      Prior to Admission medications   Medication Sig Start Date End Date Taking? Authorizing Provider  amLODipine (NORVASC) 10 MG tablet Take 10 mg by mouth daily.    Historical Provider, MD  aspirin 81 MG tablet Take 81 mg by mouth daily.    Historical Provider, MD  atorvastatin (LIPITOR) 40 MG tablet Take 40 mg by mouth daily.    Historical Provider, MD  carvedilol (COREG) 6.25 MG tablet Take 6.25 mg by mouth 2 (two) times daily with a meal.    Historical Provider, MD  Cholecalciferol (VITAMIN D3) 3000 UNITS TABS Take 1 tablet by mouth daily.    Historical Provider, MD  clindamycin (CLEOCIN) 150 MG capsule Take 150 mg by mouth 4 (four) times daily.    Historical Provider, MD  colchicine 0.6 MG tablet Take 1 tablet  (0.6 mg total) by mouth daily. 05/21/14   Shanker Kristeen Mans, MD  insulin glargine (LANTUS) 100 UNIT/ML injection Inject 0.12 mLs (12 Units total) into the skin daily. At 10 am 05/21/14   Jonetta Osgood, MD  L-Methylfolate-B6-B12 (FOLTANX PO) Take 1 tablet by mouth daily.    Historical Provider, MD  mupirocin ointment (BACTROBAN) 2 % Place 1 application into the nose 3 (three) times daily.    Historical Provider, MD  nateglinide (STARLIX) 120 MG tablet Take 120 mg by mouth 3 (three) times daily with meals.    Historical Provider, MD  oxyCODONE-acetaminophen (ROXICET) 5-325 MG per tablet Take 1 tablet by mouth every 4 (four) hours as needed for severe pain. Patient not taking: Reported on 08/22/2015 07/17/15   [name], MD  predniSONE (DELTASONE) 10 MG tablet Take 3 tablets (30 mg) daily for 2 days, then, Take 20 tablets (20 mg) daily for 2 days, then, Take 1 tablets (10 mg) daily for 2 days, then stop Patient not taking: Reported on 01/03/2015 05/21/14   Jonetta Osgood, MD  Pyridoxine HCl (VITAMIN B-6 PO) Take 1 tablet by mouth daily.    Historical Provider, MD    Physical Exam: Vitals:   07/09/16 1251 07/09/16 1253 07/09/16 1711 07/09/16 1943  BP: 155/91  143/90 173/100  Pulse: 87  72 71  Resp: 16  18 18   Temp: 97.8 F (36.6 C)     TempSrc: Oral     SpO2: 98%  100% 100%  Weight:  78.2 kg (172 lb 4.8 oz)    Height:  6\' 3"  (1.905 m)     General: Not in acute distress HEENT:       Eyes: PERRL, EOMI, no scleral icterus.       ENT: No discharge from the ears and nose, no pharynx injection, no tonsillar enlargement.        Neck: No JVD, no bruit, no mass felt. Heme: No neck lymph node enlargement. Cardiac: S1/S2, RRR, No murmurs, No gallops or rubs. Respiratory: No rales, wheezing, rhonchi or rubs. GI: Soft, nondistended, nontender, no rebound pain, no organomegaly, BS present. GU: No hematuria Ext: No pitting leg edema bilaterally. 2+DP/PT pulse bilaterally. Musculoskeletal: No  joint deformities, No joint redness or warmth, no limitation of ROM in spin. Skin: No rashes.  Neuro: Alert, oriented X3, cranial nerves II-XII grossly intact, moves all extremities normally.  Psych: Patient is not psychotic, no suicidal or hemocidal ideation.  Labs on Admission: I have personally reviewed following labs and imaging studies  CBC:  Recent Labs Lab 07/09/16 1732  WBC 10.6*  NEUTROABS 8.1*  HGB 10.2*  HCT 31.7*  MCV 76.8*  PLT [id number]   Basic Metabolic Panel:  Recent Labs Lab 07/09/16 1732  NA 132*  K 5.1  CL 102  CO2 16*  GLUCOSE 108*  BUN 150*  CREATININE 17.21*  CALCIUM 9.0   GFR: Estimated Creatinine Clearance: 5.6 mL/min (by C-G formula based on SCr  of 17.21 mg/dL). Liver Function Tests: No results for input(s): AST, ALT, ALKPHOS, BILITOT, PROT, ALBUMIN in the last 168 hours. No results for input(s): LIPASE, AMYLASE in the last 168 hours. No results for input(s): AMMONIA in the last 168 hours. Coagulation Profile: No results for input(s): INR, PROTIME in the last 168 hours. Cardiac Enzymes: No results for input(s): CKTOTAL, CKMB, CKMBINDEX, TROPONINI in the last 168 hours. BNP (last 3 results) No results for input(s): PROBNP in the last 8760 hours. HbA1C: No results for input(s): HGBA1C in the last 72 hours. CBG: No results for input(s): GLUCAP in the last 168 hours. Lipid Profile: No results for input(s): CHOL, HDL, LDLCALC, TRIG, CHOLHDL, LDLDIRECT in the last 72 hours. Thyroid Function Tests: No results for input(s): TSH, T4TOTAL, FREET4, T3FREE, THYROIDAB in the last 72 hours. Anemia Panel: No results for input(s): VITAMINB12, FOLATE, FERRITIN, TIBC, IRON, RETICCTPCT in the last 72 hours. Urine analysis:    Component Value Date/Time   COLORURINE YELLOW 05/19/2014 2008   APPEARANCEUR CLEAR 05/19/2014 2008   LABSPEC 1.010 05/19/2014 2008   PHURINE 6.0 05/19/2014 2008   GLUCOSEU NEGATIVE 05/19/2014 2008   HGBUR TRACE (A) 05/19/2014 2008     BILIRUBINUR NEGATIVE 05/19/2014 2008   [place] NEGATIVE 05/19/2014 2008   PROTEINUR 100 (A) 05/19/2014 2008   UROBILINOGEN 0.2 05/19/2014 2008   NITRITE NEGATIVE 05/19/2014 2008   LEUKOCYTESUR NEGATIVE 05/19/2014 2008   Sepsis Labs: @LABRCNTIP (procalcitonin:4,lacticidven:4) )No results found for this or any previous visit (from the past 240 hour(s)).   Radiological Exams on Admission: No results found.   EKG: Not done in ED, will get one.   Assessment/Plan Principal Problem:   ESRD on dialysis [facility]) Active Problems:   Neuropathy in diabetes ([place])   HTN (hypertension)   Diabetes mellitus without complication ([place])   Polycystic kidney disease, autosomal dominant   Gout   HLD (hyperlipidemia)   Uremia   ESRD on dialysis and uremia:  Pt missed HD since 06/21/16, now developed uremia and muscle cramps. Potassium is 5.1 at upper limits, bicarbonate 16, creatinine 17.21, BUN 150.  -will place on tele bed for obs (due to potassium is at upper limit) -renal was consulted by EDP -continue vitamin D3 and foltanx -f/u renal recommendations  DM-II: Last A1c not on record. Patient is taking lantus and Nateglinid at home. CBG 108 on admission -will decrease Lantus dose from 12 to 4 units daily -SSI -Check A1c  HLD: Last LDL was not on record -Continue home medications: lipitor -Check FLP  HTN: Bp is 143/90. -continue amlodipine, cortical,  Gout: stable -continue home allopurinol   DVT ppx: SQ Heparin  Code Status: Full code Family Communication: None at bed side.   Disposition Plan:  Anticipate discharge back to previous home environment Consults called:  Renal was consulted by EDP (EDP dose not Remember renal doctors name) Admission status: Obs / tele      Date of Service 07/09/2016    Ivor Costa [facility] Pager [phone]  If 7PM-7AM, please contact night-coverage [email or web address] Password [facility] 07/09/2016, 7:47 PM

## 2016-07-09 NOTE — ED Triage Notes (Signed)
Pt reports he urinates normally.  He feels that every two weeks he can feel "toxins build up" and knows he needs treatment at that point.

## 2016-07-10 DIAGNOSIS — N19 Unspecified kidney failure: Secondary | ICD-10-CM

## 2016-07-10 DIAGNOSIS — E119 Type 2 diabetes mellitus without complications: Secondary | ICD-10-CM

## 2016-07-10 DIAGNOSIS — Z992 Dependence on renal dialysis: Secondary | ICD-10-CM

## 2016-07-10 DIAGNOSIS — E785 Hyperlipidemia, unspecified: Secondary | ICD-10-CM | POA: Diagnosis not present

## 2016-07-10 DIAGNOSIS — N186 End stage renal disease: Secondary | ICD-10-CM | POA: Diagnosis not present

## 2016-07-10 DIAGNOSIS — M109 Gout, unspecified: Secondary | ICD-10-CM | POA: Diagnosis not present

## 2016-07-10 DIAGNOSIS — I10 Essential (primary) hypertension: Secondary | ICD-10-CM

## 2016-07-10 LAB — BASIC METABOLIC PANEL
ANION GAP: 17 — AB (ref 5–15)
BUN: 151 mg/dL — ABNORMAL HIGH (ref 6–20)
CALCIUM: 8.7 mg/dL — AB (ref 8.9–10.3)
CO2: 15 mmol/L — AB (ref 22–32)
CREATININE: 16.88 mg/dL — AB (ref 0.61–1.24)
Chloride: 100 mmol/L — ABNORMAL LOW (ref 101–111)
GFR, EST AFRICAN AMERICAN: 3 mL/min — AB (ref >60)
GFR, EST NON AFRICAN AMERICAN: 3 mL/min — AB (ref >60)
Glucose, Bld: 100 mg/dL — ABNORMAL HIGH (ref 65–99)
Potassium: 4.5 mmol/L (ref 3.5–5.1)
SODIUM: 132 mmol/L — AB (ref 135–145)

## 2016-07-10 LAB — CBC
HCT: 29.1 % — ABNORMAL LOW (ref 39.0–52.0)
HEMOGLOBIN: 9.4 g/dL — AB (ref 13.0–17.0)
MCH: 24.4 pg — AB (ref 26.0–34.0)
MCHC: 32.3 g/dL (ref 30.0–36.0)
MCV: 75.6 fL — ABNORMAL LOW (ref 78.0–100.0)
PLATELETS: 195 10*3/uL (ref 150–400)
RBC: 3.85 MIL/uL — AB (ref 4.22–5.81)
RDW: 15.2 % (ref 11.5–15.5)
WBC: 10.2 10*3/uL (ref 4.0–10.5)

## 2016-07-10 LAB — LIPID PANEL
CHOL/HDL RATIO: 5.8 ratio
CHOLESTEROL: 133 mg/dL (ref 0–200)
HDL: 23 mg/dL — ABNORMAL LOW (ref >40)
LDL Cholesterol: 73 mg/dL (ref 0–99)
TRIGLYCERIDES: 186 mg/dL — AB (ref <150)
VLDL: 37 mg/dL (ref 0–40)

## 2016-07-10 LAB — GLUCOSE, CAPILLARY
Glucose-Capillary: 107 mg/dL — ABNORMAL HIGH (ref 65–99)
Glucose-Capillary: 115 mg/dL — ABNORMAL HIGH (ref 65–99)

## 2016-07-10 MED ORDER — CALCITRIOL 0.5 MCG PO CAPS
ORAL_CAPSULE | ORAL | Status: AC
  Filled 2016-07-10: qty 2

## 2016-07-10 MED ORDER — CALCITRIOL 0.25 MCG PO CAPS
ORAL_CAPSULE | ORAL | Status: AC
  Filled 2016-07-10: qty 1

## 2016-07-10 MED ORDER — CALCITRIOL 0.5 MCG PO CAPS
1.2500 ug | ORAL_CAPSULE | Freq: Once | ORAL | Status: AC
  Administered 2016-07-10: 1.25 ug via ORAL

## 2016-07-10 MED ORDER — DARBEPOETIN ALFA 150 MCG/0.3ML IJ SOSY
PREFILLED_SYRINGE | INTRAMUSCULAR | Status: AC
  Filled 2016-07-10: qty 0.3

## 2016-07-10 MED ORDER — DARBEPOETIN ALFA 150 MCG/0.3ML IJ SOSY
150.0000 ug | PREFILLED_SYRINGE | INTRAMUSCULAR | Status: DC
  Administered 2016-07-10: 150 ug via INTRAVENOUS
  Filled 2016-07-10: qty 0.3

## 2016-07-10 NOTE — Progress Notes (Signed)
07/10/2016 2:09 PM  Donald Alvarez to be D/C'd Home per MD order.  Discussed prescriptions and follow up appointments with the patient. Prescriptions given to patient, medication list explained in detail. Pt verbalized understanding.    Medication List    STOP taking these medications   clindamycin 150 MG capsule Commonly known as:  CLEOCIN   oxyCODONE-acetaminophen 5-325 MG tablet Commonly known as:  ROXICET   predniSONE 10 MG tablet Commonly known as:  DELTASONE     TAKE these medications   amLODipine 10 MG tablet Commonly known as:  NORVASC Take 10 mg by mouth daily.   aspirin 81 MG tablet Take 81 mg by mouth daily.   atorvastatin 40 MG tablet Commonly known as:  LIPITOR Take 40 mg by mouth daily.   carvedilol 6.25 MG tablet Commonly known as:  COREG Take 6.25 mg by mouth 2 (two) times daily with a meal.   colchicine 0.6 MG tablet Take 1 tablet (0.6 mg total) by mouth daily.   FOLTANX PO Take 1 tablet by mouth daily.   insulin glargine 100 UNIT/ML injection Commonly known as:  LANTUS Inject 0.12 mLs (12 Units total) into the skin daily. At 10 am   mupirocin ointment 2 % Commonly known as:  BACTROBAN Place 1 application into the nose 3 (three) times daily.   nateglinide 120 MG tablet Commonly known as:  STARLIX Take 120 mg by mouth 3 (three) times daily with meals.   VITAMIN B-6 PO Take 1 tablet by mouth daily.   Vitamin D3 3000 units Tabs Take 1 tablet by mouth daily.       Vitals:   07/10/16 1253 07/10/16 1329  BP: (!) 150/95 (!) 153/75  Pulse: 84 82  Resp: 14 14  Temp: 97.6 F (36.4 C) 97.2 F (36.2 C)    Skin clean, dry and intact without evidence of skin break down, no evidence of skin tears noted. IV catheter discontinued intact. Site without signs and symptoms of complications. Dressing and pressure applied. Pt denies pain at this time. No complaints noted.  An After Visit Summary was printed and given to the patient. Patient  escorted via [place], and D/C home via private auto.  [employer], RN-BC, [facility] [facility] Phone [phone]

## 2016-07-10 NOTE — Progress Notes (Signed)
07/10/2016 7:51 AM  Patient has verbally expressed numerous times during bedside report that he is ready to be dialyzed now because he has things to do later on today. Will inform MD. Will continue to assess and monitor the patient.   [employer], RN-BC, [employer] [facility] Phone [phone]

## 2016-07-10 NOTE — Discharge Summary (Signed)
Physician Discharge Summary  Donald Alvarez [id number] DOB: 07-24-1966 DOA: 07/09/2016  PCP: [name], MD  Admit date: 07/09/2016 Discharge date: 07/10/2016  Time spent: 45 minutes  Recommendations for Outpatient Follow-up:  Patient will be discharged to home.  Patient will need to follow up with primary care provider within one week of discharge.  Continue hemodialysis as scheduled.  Patient should continue medications as prescribed.  Patient should follow a renal healthy diet.   Discharge Diagnoses:  ESRD/uremia Diabetes mellitus, type II Hyperlipidemia Essentially hypertension Gout  Discharge Condition: Stable  Diet recommendation: Renal  Filed Weights   07/09/16 2052 07/10/16 0950 07/10/16 1253  Weight: 78.1 kg (172 lb 1.6 oz) 78.2 kg (172 lb 6.4 oz) 76.7 kg (169 lb 1.5 oz)    History of present illness:  On 07/09/2016 by Dr. Ivor Costa Manson Alvarez is a 50 y.o. male with medical history significant of hypertension, hyperlipidemia, diabetes mellitus, gout, anemia, ESRD-HD, who presents with cramps.  Patient states that he is supposed to do hemodialysis on Monday/Wednesday/Friday, but he missed his hemodialysis since 06/21/16 due to busy work schedule. He starts having cramps in his neck and bilateral calf areas. He states that he can feel "toxins build up" and knows he needs dialysis treatment at this point. He went to [facility], but was sent to ED forb dialysis. Patient denies nausea, vomiting, diarrhea, abdominal pain. No symptoms of UTI. Denies chest pain, shortness of breath, cough, fever or chills. No unilateral weakness. No leg edema.  Hospital Course:  ESRD on dialysis and uremia -Patient Has missed dialysis since 06/21/2016. Presented with signs of uremia and muscle cramps.  -Nephrology consulted and appreciated. -Patient states he was supposed to start home dialysis however this has not occurred. Patient feels that driving to [facility] for  his dialysis is difficult given that he is still working at his dialysis center is approximate 1 hour away. He wishes to dialyze closer to home in [facility]. -Patient did dialyze today and was counseled on the importance of continuing hemodialysis as scheduled  Diabetes mellitus, type II  -Continue lantus and Nateglinid  Hyperlipidemia  -Continue statin  Essential hypertension  -continue amlodipine, Coreg  Gout -Continue allopurinol, colchicine  Procedures: None  Consultations: Nephrology  Discharge Exam: Vitals:   07/10/16 1253 07/10/16 1329  BP: (!) 150/95 (!) 153/75  Pulse: 84 82  Resp: 14 14  Temp: 97.6 F (36.4 C) 97.2 F (36.2 C)     General: Well developed, well nourished, NAD, appears stated age  HEENT: NCAT,  mucous membranes moist.  Cardiovascular: S1 S2 auscultated, no murmurs, RRR  Respiratory: Clear to auscultation bilaterally with equal chest rise  Abdomen: Soft, nontender, nondistended, + bowel sounds  Extremities: warm dry without cyanosis clubbing or edema  Neuro: AAOx3, nonfocal  Psych: Normal affect and demeanor with intact judgement and insight  Discharge Instructions Discharge Instructions    Discharge instructions    Complete by:  As directed   Patient will be discharged to home.  Patient will need to follow up with primary care provider within one week of discharge.  Continue hemodialysis as scheduled.  Patient should continue medications as prescribed.  Patient should follow a renal healthy diet.     Current Discharge Medication List    CONTINUE these medications which have NOT CHANGED   Details  amLODipine (NORVASC) 10 MG tablet Take 10 mg by mouth daily.    aspirin 81 MG tablet Take 81 mg by mouth daily.  atorvastatin (LIPITOR) 40 MG tablet Take 40 mg by mouth daily.    carvedilol (COREG) 6.25 MG tablet Take 6.25 mg by mouth 2 (two) times daily with a meal.    Cholecalciferol (VITAMIN D3) 3000 UNITS  TABS Take 1 tablet by mouth daily.    colchicine 0.6 MG tablet Take 1 tablet (0.6 mg total) by mouth daily. Qty: 30 tablet, Refills: 0    insulin glargine (LANTUS) 100 UNIT/ML injection Inject 0.12 mLs (12 Units total) into the skin daily. At 10 am Qty: 10 mL, Refills: 11    L-Methylfolate-B6-B12 (FOLTANX PO) Take 1 tablet by mouth daily.    mupirocin ointment (BACTROBAN) 2 % Place 1 application into the nose 3 (three) times daily.    nateglinide (STARLIX) 120 MG tablet Take 120 mg by mouth 3 (three) times daily with meals.    Pyridoxine HCl (VITAMIN B-6 PO) Take 1 tablet by mouth daily.      STOP taking these medications     clindamycin (CLEOCIN) 150 MG capsule      oxyCODONE-acetaminophen (ROXICET) 5-325 MG per tablet      predniSONE (DELTASONE) 10 MG tablet        Allergies  Allergen Reactions  . Lisinopril Hives  . Penicillins Hives  . Ultrasound Gel Itching and Rash   Follow-up Information    OSEI-BONSU,GEORGE, MD. Schedule an appointment as soon as possible for a visit in 1 week(s).   Specialty:  Internal Medicine Why:  Hospital follow up Contact information: [id number] [place] [address] [phone]            The results of significant diagnostics from this hospitalization (including imaging, microbiology, ancillary and laboratory) are listed below for reference.    Significant Diagnostic Studies: No results found.  Microbiology: Recent Results (from the past 240 hour(s))  MRSA PCR Screening     Status: None   Collection Time: 07/09/16  9:21 PM  Result Value Ref Range Status   MRSA by PCR NEGATIVE NEGATIVE Final    Comment:        The GeneXpert MRSA Assay (FDA approved for NASAL specimens only), is one component of a comprehensive MRSA colonization surveillance program. It is not intended to diagnose MRSA infection nor to guide or monitor treatment for MRSA infections.      Labs: Basic Metabolic Panel:  Recent  Labs Lab 07/09/16 1732 07/10/16 0514  NA 132* 132*  K 5.1 4.5  CL 102 100*  CO2 16* 15*  GLUCOSE 108* 100*  BUN 150* 151*  CREATININE 17.21* 16.88*  CALCIUM 9.0 8.7*   Liver Function Tests: No results for input(s): AST, ALT, ALKPHOS, BILITOT, PROT, ALBUMIN in the last 168 hours. No results for input(s): LIPASE, AMYLASE in the last 168 hours. No results for input(s): AMMONIA in the last 168 hours. CBC:  Recent Labs Lab 07/09/16 1732 07/10/16 0514  WBC 10.6* 10.2  NEUTROABS 8.1*  --   HGB 10.2* 9.4*  HCT 31.7* 29.1*  MCV 76.8* 75.6*  PLT 211 195   Cardiac Enzymes: No results for input(s): CKTOTAL, CKMB, CKMBINDEX, TROPONINI in the last 168 hours. BNP: BNP (last 3 results) No results for input(s): BNP in the last 8760 hours.  ProBNP (last 3 results) No results for input(s): PROBNP in the last 8760 hours.  CBG:  Recent Labs Lab 07/09/16 2050 07/10/16 0753 07/10/16 1328  GLUCAP 74 107* 115*       Signed:  Cristal Ford  [facility] 07/10/2016,  1:50 PM

## 2016-07-10 NOTE — Discharge Instructions (Signed)
End-Stage Kidney Disease The kidneys are two organs that lie on either side of the spine between the middle of the back and the front of the abdomen. The kidneys:   Remove wastes and extra water from the blood.   Produce important hormones. These help keep bones strong, regulate blood pressure, and help create red blood cells.   Balance the fluids and chemicals in the blood and tissues. End-stage kidney disease occurs when the kidneys are so damaged that they cannot do their job. When the kidneys cannot do their job, life-threatening problems occur. The body cannot stay clean and strong without the help of the kidneys. In end-stage kidney disease, the kidneys cannot get better.You need a new kidney or treatments to do some of the work healthy kidneys do in order to stay alive. CAUSES  End-stage kidney disease usually occurs when a long-lasting (chronic) kidney disease gets worse. It may also occur after the kidneys are suddenly damaged (acute kidney injury).  SYMPTOMS   Swelling (edema) of the legs, ankles, or feet.   Tiredness (lethargy).   Nausea or vomiting.   Confusion.   Problems with urination, such as:   Decreased urine production.   Frequent urination, especially at night.   Frequent accidents in children who are potty trained.   Muscle twitches and cramps.   Persistent itchiness.   Loss of appetite.   Headaches.   Abnormally dark or light skin.   Numbness in the hands or feet.   Easy bruising.   Frequent hiccups.   Menstruation stops. DIAGNOSIS  Your health care provider will measure your blood pressure and take some tests. These may include:   Urine tests.   Blood tests.   Imaging tests, such as:   An ultrasound exam.   Computed tomography (CT).  A kidney biopsy. TREATMENT  There are two treatments for end-stage kidney disease:   A procedure that removes toxic wastes from the body (dialysis).   Receiving a new kidney  (kidney transplant). Both of these treatments have serious risks and consequences. Your health care provider will help you determine which treatment is best for you based on your health, age, and other factors. In addition to having dialysis or a kidney transplant, you may need to take medicines to control high blood pressure (hypertension) and cholesterol and to decrease phosphorus levels in your blood.  HOME CARE INSTRUCTIONS  Follow your prescribed diet.   Take medicines only as directed by your health care provider.   Do not take any new medicines (prescription, over-the-counter, or nutritional supplements) unless approved by your health care provider. Many medicines can worsen your kidney damage or need to have the dose adjusted.   Keep all follow-up visits as directed by your health care provider. MAKE SURE YOU:  Understand these instructions.  Will watch your condition.  Will get help right away if you are not doing well or get worse.   This information is not intended to replace advice given to you by your health care provider. Make sure you discuss any questions you have with your health care provider.   Document Released: 01/25/2004 Document Revised: 11/25/2014 Document Reviewed: 07/03/2012 Elsevier Interactive Patient Education 2016 Elsevier Inc.  

## 2016-07-10 NOTE — Progress Notes (Signed)
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  Adherence to his dialysis Rx is abysmal as evidenced by BUN 151 Cr 16.9 bicarb  15.  He continually cites work constraints as barriers to attendance, but didn't attend when on nocturnal schedule either. He was required to come to the hospital for dialysis due to prolonged period of missing HD, almost 3 weeks.  HD orders: MWF [facility] 4 hr 400/800 EDW78 2 K 2.5 Ca no profile AVF temp 35 Mircera 150 q 2 weeks , calcitriol 1.25 heparin 7000  Due to high BUN, he should not have "full strength" HD due to risk of disequilibrium.  Have Rx 3 hr Qb 300. Hgb is 9.4 - will give 150 Aranesp.  He can be discharged today from a renal standpoint; it is critical for him to start attending HD on a regular basis.  Amalia Hailey, PA-C  Pt seen, examined and agree w A/P as above.  Kelly Splinter MD [employer] pager [phone]    cell [phone] 07/10/2016, 10:25 AM

## 2016-07-11 LAB — HEMOGLOBIN A1C
HEMOGLOBIN A1C: 5.9 % — AB (ref 4.8–5.6)
MEAN PLASMA GLUCOSE: 123 mg/dL

## 2016-08-12 DIAGNOSIS — Z0279 Encounter for issue of other medical certificate: Secondary | ICD-10-CM | POA: Diagnosis not present

## 2017-02-03 ENCOUNTER — Encounter: Payer: Self-pay | Admitting: Nephrology

## 2017-02-13 ENCOUNTER — Ambulatory Visit (INDEPENDENT_AMBULATORY_CARE_PROVIDER_SITE_OTHER): Payer: BLUE CROSS/BLUE SHIELD | Admitting: Podiatry

## 2017-02-13 ENCOUNTER — Encounter: Payer: Self-pay | Admitting: Podiatry

## 2017-02-13 VITALS — Ht 74.0 in | Wt 182.0 lb

## 2017-02-13 DIAGNOSIS — E0842 Diabetes mellitus due to underlying condition with diabetic polyneuropathy: Secondary | ICD-10-CM

## 2017-02-13 DIAGNOSIS — L851 Acquired keratosis [keratoderma] palmaris et plantaris: Secondary | ICD-10-CM | POA: Diagnosis not present

## 2017-02-13 DIAGNOSIS — M722 Plantar fascial fibromatosis: Secondary | ICD-10-CM

## 2017-02-13 DIAGNOSIS — M216X9 Other acquired deformities of unspecified foot: Secondary | ICD-10-CM

## 2017-02-13 NOTE — Progress Notes (Signed)
Subjective: 51 year old NIDDM presents with pain and burning on both feet. Patient request for a new pair orthotics and calluses trimmed. On Dialysis now.  Objective: Aggravated venous stasis dermatitis bilateral ankles with dry scaly skin and without open skin. Pedal pulses all palpable.  Decreased tactile sensations, decreased sensory response to Monofilament testing both feet. Multiple hammer toes bilateral. High arched foot with loss of plantar fat pad.  Plantar calluses under 2-4 MPJ bilateral. Left heel pain with loss of fat pad.  Assessment: NIDDM uncontrolled. Diabetic neuropathy. Plantar callus bilateral. Heel pain left secondary to loss of plantar fat pad.    Radiographic examination done. 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 Spray forefoot with long 2nd metatarsal.   Plan: 1. Discussed diabetic foot care and importance of keeping blood sugar under control. 2.  Debrided all calluses bilateral. Patient was encouraged to return to have calluses trimmed in regular basis. 3. Both feet casted for Orthotics.

## 2017-02-13 NOTE — Patient Instructions (Signed)
Both feet casted for orthotics. All calluses debrided.

## 2017-06-16 ENCOUNTER — Encounter: Payer: Self-pay | Admitting: Podiatry

## 2017-06-16 ENCOUNTER — Ambulatory Visit (INDEPENDENT_AMBULATORY_CARE_PROVIDER_SITE_OTHER): Payer: BLUE CROSS/BLUE SHIELD | Admitting: Podiatry

## 2017-06-16 DIAGNOSIS — L299 Pruritus, unspecified: Secondary | ICD-10-CM | POA: Insufficient documentation

## 2017-06-16 DIAGNOSIS — N2581 Secondary hyperparathyroidism of renal origin: Secondary | ICD-10-CM | POA: Insufficient documentation

## 2017-06-16 DIAGNOSIS — E0842 Diabetes mellitus due to underlying condition with diabetic polyneuropathy: Secondary | ICD-10-CM

## 2017-06-16 DIAGNOSIS — M722 Plantar fascial fibromatosis: Secondary | ICD-10-CM | POA: Diagnosis not present

## 2017-06-16 DIAGNOSIS — D509 Iron deficiency anemia, unspecified: Secondary | ICD-10-CM | POA: Insufficient documentation

## 2017-06-16 DIAGNOSIS — R0600 Dyspnea, unspecified: Secondary | ICD-10-CM | POA: Insufficient documentation

## 2017-06-16 DIAGNOSIS — L851 Acquired keratosis [keratoderma] palmaris et plantaris: Secondary | ICD-10-CM

## 2017-06-16 DIAGNOSIS — D631 Anemia in chronic kidney disease: Secondary | ICD-10-CM | POA: Insufficient documentation

## 2017-06-16 DIAGNOSIS — R52 Pain, unspecified: Secondary | ICD-10-CM | POA: Insufficient documentation

## 2017-06-16 NOTE — Progress Notes (Signed)
Subjective: 51 year old NIDDM presents with pain and burning on both feet. Patient requests calluses trimmed. On Dialysis now.  Objective: Aggravated venous stasis dermatitis bilateral ankles with dry scaly skin and without open skin. Pedal pulses all palpable.  Decreased tactile sensations, decreased sensory response to Monofilament testing both feet. Multiple hammer toes bilateral. High arched foot with loss of plantar fat pad.  Plantar calluses under 2-4 MPJ bilateral. Left heel pain with loss of fat pad.  Assessment: NIDDM uncontrolled. Diabetic neuropathy. Plantar callus bilateral.  Plan: Debrided all calluses bilateral.  Patient was encouraged to return to have calluses trimmed in regular basis.

## 2017-06-16 NOTE — Patient Instructions (Signed)
Seen for painful calluses. All lesions debrided. Return as needed.

## 2017-07-22 ENCOUNTER — Ambulatory Visit: Payer: BLUE CROSS/BLUE SHIELD | Admitting: Podiatry

## 2017-08-04 ENCOUNTER — Ambulatory Visit (INDEPENDENT_AMBULATORY_CARE_PROVIDER_SITE_OTHER): Payer: BLUE CROSS/BLUE SHIELD | Admitting: Podiatry

## 2017-08-04 ENCOUNTER — Encounter: Payer: Self-pay | Admitting: Podiatry

## 2017-08-04 DIAGNOSIS — L851 Acquired keratosis [keratoderma] palmaris et plantaris: Secondary | ICD-10-CM

## 2017-08-04 DIAGNOSIS — E0842 Diabetes mellitus due to underlying condition with diabetic polyneuropathy: Secondary | ICD-10-CM | POA: Diagnosis not present

## 2017-08-04 NOTE — Patient Instructions (Signed)
Seen for painful calluses. All debrided. Return as needed.

## 2017-08-04 NOTE — Progress Notes (Signed)
Subjective: 51 year old NIDDM presents complaining of painful calluses under the balls of both feet. Patient is seeking creams for dry skin. On Dialysis now and not on feet much.  Objective: Aggravated venous stasis dermatitis bilateral ankles with dry scaly skin and without open skin. Pedal pulses all palpable.  Decreased tactile sensations, decreased sensory response to Monofilament testing both feet. Multiple hammer toes bilateral. High arched foot with loss of plantar fat pad.  Plantar calluses under 2-4 MPJ bilateral.  Assessment: NIDDM uncontrolled. Diabetic neuropathy. Painful plantar callus bilateral.  Plan: Debrided all calluses bilateral.  Recommended Vitamin A cream for dry skin condition.

## 2017-09-17 DIAGNOSIS — E875 Hyperkalemia: Secondary | ICD-10-CM | POA: Insufficient documentation

## 2017-10-14 ENCOUNTER — Encounter: Payer: Self-pay | Admitting: Podiatry

## 2017-10-14 ENCOUNTER — Ambulatory Visit: Payer: BLUE CROSS/BLUE SHIELD | Admitting: Podiatry

## 2017-10-14 DIAGNOSIS — L851 Acquired keratosis [keratoderma] palmaris et plantaris: Secondary | ICD-10-CM

## 2017-10-14 DIAGNOSIS — E0842 Diabetes mellitus due to underlying condition with diabetic polyneuropathy: Secondary | ICD-10-CM | POA: Diagnosis not present

## 2017-10-14 NOTE — Progress Notes (Signed)
Subjective: 51 y.o. year old male patient presents complaining of painful feet and request calluses be trimmed. Patient is on kidney dialysis.  Objective: Dermatologic: Thick broad plantar keratosis sub 2-4 bilateral, painful with weight bearing. Venous statis dermatitis with dry pigmented skin both lower limbs. Neurologic: Loss of sensory perception both feet bilateral. Vascular: All pedal pulses are palpable. Orthopedic:high arced cavus foot with multiple hammer toe deformities 2-4 bilateral.  Assessment: Painful plantar keratosis bilateral. NIDDM under control. Diabetic neuropathy.   Treatment: All plantar lesions debrided.  Return as needed.

## 2017-10-14 NOTE — Patient Instructions (Signed)
Seen for hypertrophic calluses.  All lesions debrided. Return as needed.

## 2017-10-28 ENCOUNTER — Telehealth: Payer: Self-pay | Admitting: *Deleted

## 2017-10-28 NOTE — Telephone Encounter (Signed)
Call from patient c/o redness, swelling, bruise and tenderness at A/V fistula past being stuck on Saturday at [facility]. Claims there is some swelling at this area and states too painful for dialysis. Caryl Pina state patient only come to dialysis at every other scheduled day.  See fax from [facility].

## 2017-10-29 ENCOUNTER — Encounter: Payer: Self-pay | Admitting: Vascular Surgery

## 2017-10-29 ENCOUNTER — Other Ambulatory Visit: Payer: Self-pay

## 2017-10-29 ENCOUNTER — Ambulatory Visit (INDEPENDENT_AMBULATORY_CARE_PROVIDER_SITE_OTHER): Payer: BLUE CROSS/BLUE SHIELD | Admitting: Vascular Surgery

## 2017-10-29 VITALS — BP 150/89 | HR 79 | Temp 97.5°F | Resp 18 | Ht 74.0 in | Wt 179.0 lb

## 2017-10-29 DIAGNOSIS — N186 End stage renal disease: Secondary | ICD-10-CM

## 2017-10-29 DIAGNOSIS — Z992 Dependence on renal dialysis: Secondary | ICD-10-CM | POA: Diagnosis not present

## 2017-10-29 NOTE — Progress Notes (Signed)
[facility]ist of [facility]  Patient name: Donald Alvarez MRN: [id number] DOB: 22-Jan-1966 Sex: male  REASON FOR VISIT: Pain, erythema and swelling in left arm AV fistula site.  HPI: Donald Alvarez is a 51 y.o. male seen today for evaluation.  He is well-known to me from AV fistula creation 2 years ago.  He was stage IV chronic renal insufficiency and subsequently began hemodialysis and has been on dialysis for approximately 2 years.  He reports that last week, the hemodialysis technologist was attempting to use a new access site and created an infiltration.  Reportedly clotted the machine and had to redo the tubing.  Since that time he has had pain and erythema and is concerned regarding this and is here today the symptoms of ischemia in his left hand.  Past Medical History:  Diagnosis Date  . Anemia   . Complication of anesthesia    Pt stated that his blood preesure must be monitored closely during any surgical procedure ( per neurologist)  . Diabetes mellitus without complication ([place])   . ESRD on dialysis ([place])   . Gout   . HLD (hyperlipidemia)   . Hyperparathyroidism due to renal insufficiency ([place])   . Hypertension   . Renal disorder     Family History  Problem Relation Age of Onset  . Diabetes Mellitus II Mother   . Kidney disease Mother   . Diabetes Mother   . Hypertension Mother   . CAD Father   . Heart disease Father   . Hypertension Father     SOCIAL HISTORY: Social History   Tobacco Use  . Smoking status: Never Smoker  . Smokeless tobacco: Never Used  Substance Use Topics  . Alcohol use: No    Alcohol/week: 0.0 oz    Allergies  Allergen Reactions  . Lisinopril Hives  . Other Other (See Comments)    Itching and rash from "ultrasound gel"  . Penicillin G Rash  . Penicillins Hives  . Ultrasound Gel Itching and Rash    Current Outpatient Medications  Medication Sig Dispense Refill  . amLODipine  (NORVASC) 10 MG tablet Take 10 mg by mouth daily.    Marland Kitchen aspirin 81 MG tablet Take 81 mg by mouth daily.    . Cholecalciferol (VITAMIN D3) 3000 UNITS TABS Take 1 tablet by mouth daily.    . insulin glargine (LANTUS) 100 UNIT/ML injection Inject 0.12 mLs (12 Units total) into the skin daily. At 10 am 10 mL 11  . nateglinide (STARLIX) 120 MG tablet Take 120 mg by mouth 3 (three) times daily with meals.    Marland Kitchen atorvastatin (LIPITOR) 40 MG tablet Take 40 mg by mouth daily.    . carvedilol (COREG) 6.25 MG tablet Take 6.25 mg by mouth 2 (two) times daily with a meal.    . colchicine 0.6 MG tablet Take 1 tablet (0.6 mg total) by mouth daily. (Patient not taking: Reported on 10/29/2017) 30 tablet 0  . febuxostat (ULORIC) 40 MG tablet Take 40 mg by mouth.    Marland Kitchen L-Methylfolate-B6-B12 (FOLTANX PO) Take 1 tablet by mouth daily.    Marland Kitchen labetalol (NORMODYNE) 200 MG tablet   1  . mupirocin ointment (BACTROBAN) 2 % Place 1 application into the nose 3 (three) times daily.    . Pyridoxine HCl (VITAMIN B-6 PO) Take 1 tablet by mouth daily.     No current facility-administered medications for this visit.     REVIEW OF SYSTEMS:  [X]  denotes positive finding, [ ]   denotes negative finding Cardiac  Comments:  Chest pain or chest pressure:    Shortness of breath upon exertion:    Short of breath when lying flat:    Irregular heart rhythm:        Vascular    Pain in calf, thigh, or hip brought on by ambulation:    Pain in feet at night that wakes you up from your sleep:     Blood clot in your veins:    Leg swelling:           PHYSICAL EXAM: Vitals:   10/29/17 1326 10/29/17 1334  BP: (!) 158/94 (!) 150/89  Pulse: 83 79  Resp: 18   Temp: (!) 97.5 F (36.4 C)   TempSrc: Oral   SpO2: 98%   Weight: 179 lb (81.2 kg)   Height: 6\' 2"  (1.88 m)     GENERAL: The patient is a well-nourished male, in no acute distress. The vital signs are documented above. CARDIOVASCULAR: Affable left radial pulse.  He has an  excellent thrill and brachiocephalic AV fistula.  The main access for his fistula has been in 2 separate locations and there is some aneurysmal degeneration at these 2 sites.  The attempted alternate site I does not have any evidence false aneurysm.  There is fullness and tenderness medial to the fistula at the level of the biceps.  There is some mild erythema related to bruising but no expansile mass this is just any false aneurysm and no evidence of infection. PULMONARY: There is good air exchange  MUSCULOSKELETAL: There are no major deformities or cyanosis. NEUROLOGIC: No focal weakness or paresthesias are detected. SKIN: There are no ulcers or rashes noted. PSYCHIATRIC: The patient has a normal affect.  DATA:  None  MEDICAL ISSUES: I discussed this with the patient at length.  Explained that he has had a infiltration with bleeding outside of his fistula with the attempt at new access site.  I explained that it is safe to continue use of his current fistula since it does appear to have ongoing excellent function.  I do not feel that there is any issue regarding using the 2 areas of her currently used with rotation within these areas.  There is no evidence of skin breakdown and no large aneurysm formation.  He was reassured of this discussion and will see [place] again on on an as-needed basis    [name], MD [facility] Tel [phone] Pager [phone]

## 2018-04-29 ENCOUNTER — Ambulatory Visit: Payer: BLUE CROSS/BLUE SHIELD | Admitting: Podiatry

## 2018-04-29 ENCOUNTER — Encounter: Payer: Self-pay | Admitting: Podiatry

## 2018-04-29 ENCOUNTER — Telehealth: Payer: Self-pay | Admitting: *Deleted

## 2018-04-29 DIAGNOSIS — L851 Acquired keratosis [keratoderma] palmaris et plantaris: Secondary | ICD-10-CM | POA: Diagnosis not present

## 2018-04-29 DIAGNOSIS — M2042 Other hammer toe(s) (acquired), left foot: Secondary | ICD-10-CM

## 2018-04-29 DIAGNOSIS — E0842 Diabetes mellitus due to underlying condition with diabetic polyneuropathy: Secondary | ICD-10-CM | POA: Diagnosis not present

## 2018-04-29 NOTE — Patient Instructions (Signed)
Seen for painful corns and calluses. Noted of hammer toe with digital corn 5th left. Thick plantar calluses with thin plantar fat pad. May benefit from a new pair orthotics with added cushion. Return in 3 months or sooner if needed.

## 2018-04-29 NOTE — Telephone Encounter (Signed)
For L3020 if the orthotics are billed with an office visit the patient is responsible for just his $20 copay. The oprthotics will be covered at 100% of the allowed amount. Called and notified patient. He will call back on Monday to schedule appt after he looks at his schedule

## 2018-04-29 NOTE — Progress Notes (Signed)
Subjective: 52 y.o. year old male patient presents complaining of painful feet from thick calluses. Patient is on Kidney dialysis.  Objective: Dermatologic: Painful digital corn 5th left. Plantar calluses under the ball of both feet. Vascular: Pedal pulses are all palpable. Orthopedic: Contracted lesser digits with loss of plantar fat pad. Prominent metatarsal head planetary bilateral. Neurologic: Diagnosed with diabetic neuropathy.  Assessment: Hammer toe 5 left. Plantar calluses bilateral. Pain in both feet.  Treatment: All mycotic corns and calluses debrided.  May benefit from a new pair orthotics with added cushion. Return in 3 months or sooner if needed.

## 2018-05-19 DIAGNOSIS — R200 Anesthesia of skin: Secondary | ICD-10-CM | POA: Insufficient documentation

## 2018-05-19 DIAGNOSIS — I517 Cardiomegaly: Secondary | ICD-10-CM | POA: Insufficient documentation

## 2018-07-20 DIAGNOSIS — J189 Pneumonia, unspecified organism: Secondary | ICD-10-CM | POA: Insufficient documentation

## 2018-07-20 DIAGNOSIS — Y95 Nosocomial condition: Secondary | ICD-10-CM | POA: Insufficient documentation

## 2018-07-26 ENCOUNTER — Emergency Department (HOSPITAL_BASED_OUTPATIENT_CLINIC_OR_DEPARTMENT_OTHER): Payer: Medicare Other

## 2018-07-26 ENCOUNTER — Encounter (HOSPITAL_BASED_OUTPATIENT_CLINIC_OR_DEPARTMENT_OTHER): Payer: Self-pay | Admitting: Emergency Medicine

## 2018-07-26 ENCOUNTER — Inpatient Hospital Stay (HOSPITAL_BASED_OUTPATIENT_CLINIC_OR_DEPARTMENT_OTHER)
Admission: EM | Admit: 2018-07-26 | Discharge: 2018-07-28 | DRG: 193 | Disposition: A | Discharge status: Home / Self Care | Payer: Medicare Other | Attending: Internal Medicine | Admitting: Internal Medicine

## 2018-07-26 ENCOUNTER — Other Ambulatory Visit: Payer: Self-pay

## 2018-07-26 DIAGNOSIS — Z888 Allergy status to other drugs, medicaments and biological substances status: Secondary | ICD-10-CM

## 2018-07-26 DIAGNOSIS — Z880 Allergy status to penicillin: Secondary | ICD-10-CM

## 2018-07-26 DIAGNOSIS — Z8249 Family history of ischemic heart disease and other diseases of the circulatory system: Secondary | ICD-10-CM | POA: Diagnosis not present

## 2018-07-26 DIAGNOSIS — D631 Anemia in chronic kidney disease: Secondary | ICD-10-CM | POA: Diagnosis present

## 2018-07-26 DIAGNOSIS — Z7982 Long term (current) use of aspirin: Secondary | ICD-10-CM | POA: Diagnosis not present

## 2018-07-26 DIAGNOSIS — E118 Type 2 diabetes mellitus with unspecified complications: Secondary | ICD-10-CM | POA: Diagnosis not present

## 2018-07-26 DIAGNOSIS — Y95 Nosocomial condition: Secondary | ICD-10-CM | POA: Diagnosis present

## 2018-07-26 DIAGNOSIS — Z79899 Other long term (current) drug therapy: Secondary | ICD-10-CM

## 2018-07-26 DIAGNOSIS — N2581 Secondary hyperparathyroidism of renal origin: Secondary | ICD-10-CM | POA: Diagnosis present

## 2018-07-26 DIAGNOSIS — Z833 Family history of diabetes mellitus: Secondary | ICD-10-CM | POA: Diagnosis not present

## 2018-07-26 DIAGNOSIS — R0602 Shortness of breath: Secondary | ICD-10-CM | POA: Diagnosis not present

## 2018-07-26 DIAGNOSIS — M109 Gout, unspecified: Secondary | ICD-10-CM | POA: Diagnosis present

## 2018-07-26 DIAGNOSIS — Q613 Polycystic kidney, unspecified: Secondary | ICD-10-CM | POA: Diagnosis not present

## 2018-07-26 DIAGNOSIS — Z794 Long term (current) use of insulin: Secondary | ICD-10-CM

## 2018-07-26 DIAGNOSIS — Z841 Family history of disorders of kidney and ureter: Secondary | ICD-10-CM | POA: Diagnosis not present

## 2018-07-26 DIAGNOSIS — J189 Pneumonia, unspecified organism: Principal | ICD-10-CM | POA: Diagnosis present

## 2018-07-26 DIAGNOSIS — J810 Acute pulmonary edema: Secondary | ICD-10-CM | POA: Diagnosis not present

## 2018-07-26 DIAGNOSIS — N186 End stage renal disease: Secondary | ICD-10-CM | POA: Diagnosis present

## 2018-07-26 DIAGNOSIS — Z9109 Other allergy status, other than to drugs and biological substances: Secondary | ICD-10-CM | POA: Diagnosis not present

## 2018-07-26 DIAGNOSIS — Z8701 Personal history of pneumonia (recurrent): Secondary | ICD-10-CM | POA: Diagnosis not present

## 2018-07-26 DIAGNOSIS — E785 Hyperlipidemia, unspecified: Secondary | ICD-10-CM | POA: Diagnosis present

## 2018-07-26 DIAGNOSIS — E8770 Fluid overload, unspecified: Secondary | ICD-10-CM | POA: Diagnosis present

## 2018-07-26 DIAGNOSIS — Z9115 Patient's noncompliance with renal dialysis: Secondary | ICD-10-CM

## 2018-07-26 DIAGNOSIS — I10 Essential (primary) hypertension: Secondary | ICD-10-CM | POA: Diagnosis not present

## 2018-07-26 DIAGNOSIS — E1122 Type 2 diabetes mellitus with diabetic chronic kidney disease: Secondary | ICD-10-CM | POA: Diagnosis present

## 2018-07-26 DIAGNOSIS — J9601 Acute respiratory failure with hypoxia: Secondary | ICD-10-CM | POA: Diagnosis present

## 2018-07-26 DIAGNOSIS — I120 Hypertensive chronic kidney disease with stage 5 chronic kidney disease or end stage renal disease: Secondary | ICD-10-CM | POA: Diagnosis present

## 2018-07-26 DIAGNOSIS — Z992 Dependence on renal dialysis: Secondary | ICD-10-CM | POA: Diagnosis not present

## 2018-07-26 DIAGNOSIS — R011 Cardiac murmur, unspecified: Secondary | ICD-10-CM | POA: Diagnosis present

## 2018-07-26 LAB — COMPREHENSIVE METABOLIC PANEL
ALBUMIN: 3.7 g/dL (ref 3.5–5.0)
ALT: 16 U/L (ref 0–44)
ANION GAP: 18 — AB (ref 5–15)
AST: 23 U/L (ref 15–41)
Alkaline Phosphatase: 57 U/L (ref 38–126)
BUN: 69 mg/dL — ABNORMAL HIGH (ref 6–20)
CHLORIDE: 99 mmol/L (ref 98–111)
CO2: 20 mmol/L — AB (ref 22–32)
Calcium: 8.5 mg/dL — ABNORMAL LOW (ref 8.9–10.3)
Creatinine, Ser: 10.68 mg/dL — ABNORMAL HIGH (ref 0.61–1.24)
GFR calc non Af Amer: 5 mL/min — ABNORMAL LOW (ref >60)
GFR, EST AFRICAN AMERICAN: 6 mL/min — AB (ref >60)
Glucose, Bld: 99 mg/dL (ref 70–99)
Potassium: 5.8 mmol/L — ABNORMAL HIGH (ref 3.5–5.1)
SODIUM: 137 mmol/L (ref 135–145)
Total Bilirubin: 1.2 mg/dL (ref 0.3–1.2)
Total Protein: 7.1 g/dL (ref 6.5–8.1)

## 2018-07-26 LAB — I-STAT VENOUS BLOOD GAS, ED
Acid-base deficit: 5 mmol/L — ABNORMAL HIGH (ref 0.0–2.0)
Acid-base deficit: 4 mmol/L — ABNORMAL HIGH (ref 0.0–2.0)
Bicarbonate: 19.7 mmol/L — ABNORMAL LOW (ref 20.0–28.0)
Bicarbonate: 20.8 mmol/L (ref 20.0–28.0)
O2 SAT: 62 %
O2 Saturation: 82 %
PCO2 VEN: 32.3 mmHg — AB (ref 44.0–60.0)
PCO2 VEN: 36.9 mmHg — AB (ref 44.0–60.0)
PH VEN: 7.392 (ref 7.250–7.430)
PH VEN: 7.36 (ref 7.250–7.430)
PO2 VEN: 33 mmHg (ref 32.0–45.0)
Patient temperature: 98.1
TCO2: 21 mmol/L — ABNORMAL LOW (ref 22–32)
TCO2: 22 mmol/L (ref 22–32)
pO2, Ven: 45 mmHg (ref 32.0–45.0)

## 2018-07-26 LAB — CBC
HCT: 25.9 % — ABNORMAL LOW (ref 39.0–52.0)
HEMATOCRIT: 26 % — AB (ref 39.0–52.0)
HEMOGLOBIN: 8.1 g/dL — AB (ref 13.0–17.0)
Hemoglobin: 8.5 g/dL — ABNORMAL LOW (ref 13.0–17.0)
MCH: 26.4 pg (ref 26.0–34.0)
MCH: 25.9 pg — ABNORMAL LOW (ref 26.0–34.0)
MCHC: 32.8 g/dL (ref 30.0–36.0)
MCHC: 31.2 g/dL (ref 30.0–36.0)
MCV: 80.4 fL (ref 78.0–100.0)
MCV: 83.1 fL (ref 78.0–100.0)
PLATELETS: 181 10*3/uL (ref 150–400)
Platelets: 179 10*3/uL (ref 150–400)
RBC: 3.22 MIL/uL — AB (ref 4.22–5.81)
RBC: 3.13 MIL/uL — ABNORMAL LOW (ref 4.22–5.81)
RDW: 16.5 % — ABNORMAL HIGH (ref 11.5–15.5)
RDW: 16.2 % — ABNORMAL HIGH (ref 11.5–15.5)
WBC: 10 10*3/uL (ref 4.0–10.5)
WBC: 11.4 10*3/uL — AB (ref 4.0–10.5)

## 2018-07-26 LAB — BRAIN NATRIURETIC PEPTIDE: B Natriuretic Peptide: 1931.3 pg/mL — ABNORMAL HIGH (ref 0.0–100.0)

## 2018-07-26 LAB — CREATININE, SERUM
Creatinine, Ser: 11.29 mg/dL — ABNORMAL HIGH (ref 0.61–1.24)
GFR, EST AFRICAN AMERICAN: 5 mL/min — AB (ref >60)
GFR, EST NON AFRICAN AMERICAN: 4 mL/min — AB (ref >60)

## 2018-07-26 LAB — STREP PNEUMONIAE URINARY ANTIGEN: STREP PNEUMO URINARY ANTIGEN: NEGATIVE

## 2018-07-26 LAB — TROPONIN I: TROPONIN I: 0.08 ng/mL — AB (ref <0.03)

## 2018-07-26 LAB — I-STAT CG4 LACTIC ACID, ED: Lactic Acid, Venous: 0.77 mmol/L (ref 0.5–1.9)

## 2018-07-26 MED ORDER — VITAMIN B-6 100 MG PO TABS
50.0000 mg | ORAL_TABLET | Freq: Every day | ORAL | Status: DC
  Filled 2018-07-26: qty 1

## 2018-07-26 MED ORDER — SODIUM CHLORIDE 0.9 % IV SOLN
INTRAVENOUS | Status: DC | PRN
  Administered 2018-07-26 (×2): via INTRAVENOUS

## 2018-07-26 MED ORDER — VANCOMYCIN HCL IN DEXTROSE 750-5 MG/150ML-% IV SOLN
INTRAVENOUS | Status: AC
  Administered 2018-07-26: 750 mg via INTRAVENOUS
  Filled 2018-07-26: qty 150

## 2018-07-26 MED ORDER — NIFEDIPINE ER OSMOTIC RELEASE 30 MG PO TB24
60.00 | ORAL_TABLET | ORAL | Status: DC
Start: 2018-07-24 — End: 2018-07-26

## 2018-07-26 MED ORDER — LOSARTAN POTASSIUM 50 MG PO TABS
100.00 | ORAL_TABLET | ORAL | Status: DC
Start: 2018-07-25 — End: 2018-07-26

## 2018-07-26 MED ORDER — ATORVASTATIN CALCIUM 40 MG PO TABS
40.00 | ORAL_TABLET | ORAL | Status: DC
Start: 2018-07-24 — End: 2018-07-26

## 2018-07-26 MED ORDER — NITROGLYCERIN 0.4 MG SL SUBL
0.40 | SUBLINGUAL_TABLET | SUBLINGUAL | Status: DC
Start: ? — End: 2018-07-26

## 2018-07-26 MED ORDER — ACETAMINOPHEN 325 MG PO TABS
650.00 | ORAL_TABLET | ORAL | Status: DC
Start: ? — End: 2018-07-26

## 2018-07-26 MED ORDER — HYDRALAZINE HCL 20 MG/ML IJ SOLN
20.00 | INTRAMUSCULAR | Status: DC
Start: ? — End: 2018-07-26

## 2018-07-26 MED ORDER — CARVEDILOL 6.25 MG PO TABS
6.2500 mg | ORAL_TABLET | Freq: Two times a day (BID) | ORAL | Status: DC
  Administered 2018-07-27 – 2018-07-28 (×4): 6.25 mg via ORAL
  Filled 2018-07-26 (×5): qty 1

## 2018-07-26 MED ORDER — LORAZEPAM 2 MG/ML IJ SOLN
0.5000 mg | Freq: Once | INTRAMUSCULAR | Status: AC
  Administered 2018-07-26: 0.5 mg via INTRAVENOUS
  Filled 2018-07-26: qty 1

## 2018-07-26 MED ORDER — L-METHYLFOLATE-B6-B12 3-35-2 MG PO TABS
1.0000 | ORAL_TABLET | Freq: Every day | ORAL | Status: DC
  Filled 2018-07-26: qty 1

## 2018-07-26 MED ORDER — VITAMIN D 1000 UNITS PO TABS
1000.0000 [IU] | ORAL_TABLET | Freq: Every day | ORAL | Status: DC
  Filled 2018-07-26: qty 1

## 2018-07-26 MED ORDER — FEBUXOSTAT 40 MG PO TABS
40.00 | ORAL_TABLET | ORAL | Status: DC
Start: 2018-07-25 — End: 2018-07-26

## 2018-07-26 MED ORDER — DEXTROSE 10 % IV SOLN
125.00 | INTRAVENOUS | Status: DC
Start: ? — End: 2018-07-26

## 2018-07-26 MED ORDER — HYDRALAZINE HCL 20 MG/ML IJ SOLN
INTRAMUSCULAR | Status: AC
  Administered 2018-07-26: 10 mg via INTRAVENOUS
  Filled 2018-07-26: qty 1

## 2018-07-26 MED ORDER — VANCOMYCIN VARIABLE DOSE PER UNSTABLE RENAL FUNCTION (PHARMACIST DOSING): Status: DC

## 2018-07-26 MED ORDER — HEPARIN SODIUM (PORCINE) 5000 UNIT/ML IJ SOLN
5000.0000 [IU] | Freq: Three times a day (TID) | INTRAMUSCULAR | Status: DC
  Filled 2018-07-26 (×2): qty 1

## 2018-07-26 MED ORDER — SODIUM CHLORIDE 0.9% FLUSH: 3.0000 mL | INTRAVENOUS | Status: DC | PRN

## 2018-07-26 MED ORDER — LEVOFLOXACIN IN D5W 500 MG/100ML IV SOLN
500.0000 mg | Freq: Once | INTRAVENOUS | Status: AC
  Administered 2018-07-26: 500 mg via INTRAVENOUS
  Filled 2018-07-26: qty 100

## 2018-07-26 MED ORDER — NATEGLINIDE 120 MG PO TABS: 120.0000 mg | ORAL_TABLET | Freq: Three times a day (TID) | ORAL | Status: DC

## 2018-07-26 MED ORDER — LABETALOL HCL 5 MG/ML IV SOLN
10.00 | INTRAVENOUS | Status: DC
Start: ? — End: 2018-07-26

## 2018-07-26 MED ORDER — CHLORHEXIDINE GLUCONATE CLOTH 2 % EX PADS
6.0000 | MEDICATED_PAD | Freq: Every day | CUTANEOUS | Status: DC
  Administered 2018-07-27: 6 via TOPICAL

## 2018-07-26 MED ORDER — HYDRALAZINE HCL 20 MG/ML IJ SOLN
10.00 | INTRAMUSCULAR | Status: DC
Start: ? — End: 2018-07-26

## 2018-07-26 MED ORDER — ASPIRIN EC 81 MG PO TBEC
81.0000 mg | DELAYED_RELEASE_TABLET | Freq: Every day | ORAL | Status: DC
  Administered 2018-07-27: 81 mg via ORAL
  Filled 2018-07-26 (×2): qty 1

## 2018-07-26 MED ORDER — INSULIN GLARGINE 100 UNIT/ML ~~LOC~~ SOLN
12.0000 [IU] | Freq: Every day | SUBCUTANEOUS | Status: DC
  Filled 2018-07-26: qty 0.12

## 2018-07-26 MED ORDER — HYDRALAZINE HCL 20 MG/ML IJ SOLN
10.0000 mg | INTRAMUSCULAR | Status: DC | PRN
  Administered 2018-07-26: 10 mg via INTRAVENOUS

## 2018-07-26 MED ORDER — GUAIFENESIN ER 600 MG PO TB12
600.00 | ORAL_TABLET | ORAL | Status: DC
Start: 2018-07-24 — End: 2018-07-26

## 2018-07-26 MED ORDER — GENERIC EXTERNAL MEDICATION
2.50 | Status: DC
Start: ? — End: 2018-07-26

## 2018-07-26 MED ORDER — ONDANSETRON HCL 4 MG/2ML IJ SOLN
4.00 | INTRAMUSCULAR | Status: DC
Start: ? — End: 2018-07-26

## 2018-07-26 MED ORDER — DIPHENHYDRAMINE HCL 50 MG/ML IJ SOLN
25.00 | INTRAMUSCULAR | Status: DC
Start: ? — End: 2018-07-26

## 2018-07-26 MED ORDER — IPRATROPIUM-ALBUTEROL 0.5-2.5 (3) MG/3ML IN SOLN
RESPIRATORY_TRACT | Status: AC
  Administered 2018-07-26: 3 mL
  Filled 2018-07-26: qty 3

## 2018-07-26 MED ORDER — ALBUTEROL SULFATE (2.5 MG/3ML) 0.083% IN NEBU
2.50 | INHALATION_SOLUTION | RESPIRATORY_TRACT | Status: DC
Start: ? — End: 2018-07-26

## 2018-07-26 MED ORDER — VANCOMYCIN HCL IN DEXTROSE 1-5 GM/200ML-% IV SOLN
1000.0000 mg | Freq: Once | INTRAVENOUS | Status: AC
  Administered 2018-07-26: 1000 mg via INTRAVENOUS
  Filled 2018-07-26: qty 200

## 2018-07-26 MED ORDER — ALBUTEROL SULFATE (2.5 MG/3ML) 0.083% IN NEBU
2.5000 mg | INHALATION_SOLUTION | Freq: Once | RESPIRATORY_TRACT | Status: AC
  Administered 2018-07-26: 2.5 mg via RESPIRATORY_TRACT
  Filled 2018-07-26: qty 3

## 2018-07-26 MED ORDER — ATORVASTATIN CALCIUM 40 MG PO TABS
40.0000 mg | ORAL_TABLET | Freq: Every day | ORAL | Status: DC
  Administered 2018-07-27: 40 mg via ORAL
  Filled 2018-07-26 (×2): qty 1

## 2018-07-26 MED ORDER — MUPIROCIN 2 % EX OINT: 1.0000 "application " | TOPICAL_OINTMENT | Freq: Three times a day (TID) | CUTANEOUS | Status: DC

## 2018-07-26 MED ORDER — AMLODIPINE BESYLATE 10 MG PO TABS
10.0000 mg | ORAL_TABLET | Freq: Every day | ORAL | Status: DC
  Administered 2018-07-27: 10 mg via ORAL
  Filled 2018-07-26 (×2): qty 1

## 2018-07-26 MED ORDER — IPRATROPIUM-ALBUTEROL 0.5-2.5 (3) MG/3ML IN SOLN
3.0000 mL | Freq: Once | RESPIRATORY_TRACT | Status: AC
  Administered 2018-07-26: 3 mL via RESPIRATORY_TRACT
  Filled 2018-07-26: qty 3

## 2018-07-26 MED ORDER — GLUCOSE 40 % PO GEL
15.00 g | ORAL | Status: DC
Start: ? — End: 2018-07-26

## 2018-07-26 MED ORDER — HYDRALAZINE HCL 50 MG PO TABS
100.00 | ORAL_TABLET | ORAL | Status: DC
Start: 2018-07-24 — End: 2018-07-26

## 2018-07-26 MED ORDER — GENERIC EXTERNAL MEDICATION
1.00 | Status: DC
Start: ? — End: 2018-07-26

## 2018-07-26 MED ORDER — DIPHENHYDRAMINE HCL 25 MG PO CAPS
25.00 | ORAL_CAPSULE | ORAL | Status: DC
Start: ? — End: 2018-07-26

## 2018-07-26 MED ORDER — SODIUM CHLORIDE 0.9 % IV SOLN: 100.0000 mL | INTRAVENOUS | Status: DC | PRN

## 2018-07-26 MED ORDER — SODIUM CHLORIDE 0.9 % IV SOLN
1.0000 g | INTRAVENOUS | Status: DC
  Administered 2018-07-27: 1 g via INTRAVENOUS
  Filled 2018-07-26 (×2): qty 1

## 2018-07-26 MED ORDER — HEPARIN SODIUM (PORCINE) 5000 UNIT/ML IJ SOLN
5000.00 | INTRAMUSCULAR | Status: DC
Start: 2018-07-24 — End: 2018-07-26

## 2018-07-26 MED ORDER — SODIUM CHLORIDE 0.9% FLUSH
3.0000 mL | Freq: Two times a day (BID) | INTRAVENOUS | Status: DC
  Administered 2018-07-27 (×3): 3 mL via INTRAVENOUS

## 2018-07-26 MED ORDER — VANCOMYCIN HCL 10 G IV SOLR
1750.0000 mg | Freq: Once | INTRAVENOUS | Status: DC
  Filled 2018-07-26: qty 1750

## 2018-07-26 MED ORDER — SODIUM CHLORIDE 0.9 % IV SOLN: 250.0000 mL | INTRAVENOUS | Status: DC | PRN

## 2018-07-26 MED ORDER — ALBUTEROL SULFATE (2.5 MG/3ML) 0.083% IN NEBU
INHALATION_SOLUTION | RESPIRATORY_TRACT | Status: AC
  Administered 2018-07-26: 2.5 mg
  Filled 2018-07-26: qty 3

## 2018-07-26 MED ORDER — LEVOFLOXACIN 500 MG PO TABS
500.00 | ORAL_TABLET | ORAL | Status: DC
Start: 2018-07-25 — End: 2018-07-26

## 2018-07-26 MED ORDER — CALCIUM ACETATE (PHOS BINDER) 667 MG PO CAPS
667.00 | ORAL_CAPSULE | ORAL | Status: DC
Start: 2018-07-24 — End: 2018-07-26

## 2018-07-26 MED ORDER — SODIUM CHLORIDE 0.9 % IV SOLN
1.0000 g | Freq: Once | INTRAVENOUS | Status: DC
  Filled 2018-07-26: qty 1

## 2018-07-26 MED ORDER — CARVEDILOL 12.5 MG PO TABS
25.00 | ORAL_TABLET | ORAL | Status: DC
Start: 2018-07-24 — End: 2018-07-26

## 2018-07-26 MED ORDER — VANCOMYCIN HCL IN DEXTROSE 750-5 MG/150ML-% IV SOLN
750.0000 mg | Freq: Once | INTRAVENOUS | Status: AC
  Administered 2018-07-26: 750 mg via INTRAVENOUS
  Filled 2018-07-26: qty 150

## 2018-07-26 MED ORDER — FUROSEMIDE 10 MG/ML IJ SOLN
20.0000 mg | Freq: Once | INTRAMUSCULAR | Status: AC
  Administered 2018-07-26: 20 mg via INTRAVENOUS
  Filled 2018-07-26: qty 2

## 2018-07-26 MED ORDER — PENTAFLUOROPROP-TETRAFLUOROETH EX AERO: 1.0000 "application " | INHALATION_SPRAY | CUTANEOUS | Status: DC | PRN

## 2018-07-26 MED ORDER — INSULIN ASPART 100 UNIT/ML ~~LOC~~ SOLN: 0.0000 [IU] | Freq: Three times a day (TID) | SUBCUTANEOUS | Status: DC

## 2018-07-26 MED ORDER — CLONIDINE HCL 0.1 MG PO TABS
0.10 | ORAL_TABLET | ORAL | Status: DC
Start: ? — End: 2018-07-26

## 2018-07-26 MED ORDER — INSULIN LISPRO 100 UNIT/ML ~~LOC~~ SOLN
2.00 | SUBCUTANEOUS | Status: DC
Start: 2018-07-24 — End: 2018-07-26

## 2018-07-26 MED ORDER — LIDOCAINE-PRILOCAINE 2.5-2.5 % EX CREA
1.0000 "application " | TOPICAL_CREAM | CUTANEOUS | Status: DC | PRN
  Filled 2018-07-26: qty 5

## 2018-07-26 NOTE — ED Notes (Signed)
Patient transported to X-ray 

## 2018-07-26 NOTE — ED Notes (Signed)
Troponin 0.08, results given to ED MD and RN

## 2018-07-26 NOTE — ED Notes (Signed)
Per Dr. Sedonia Small, no repeat troponin needed

## 2018-07-26 NOTE — ED Notes (Signed)
[facility] at bedside 

## 2018-07-26 NOTE — ED Triage Notes (Addendum)
SOB x 2 days, states he was dx with pneumonia and has been taking abx but SOB is getting worse. Pt states he was admitted to [facility]  For same and is not improving.

## 2018-07-26 NOTE — Plan of Care (Signed)
Discussed with patient plan of care for the evening, pain management and dialysis with meal possible after he returns with some teach back displayed

## 2018-07-26 NOTE — ED Provider Notes (Signed)
[facility] Emergency Department Provider Note MRN:  [id number]  Arrival date & time: 07/26/18     Chief Complaint   Shortness of Breath   History of Present Illness   Donald Alvarez is a 52 y.o. year-old male with a history of polycystic kidney disease, ESRD on dialysis, diabetes presenting to the ED with chief complaint of shortness of breath.  Patient explains that he has been struggling with recurrent pneumonia since July.  2 hospitalizations at [facility], discharged few days ago.  Continued aggressively worsening shortness of breath, despite being on antibiotics.  Denies chest pain, no fevers, no abdominal pain.  No headache, no vision change.  Shortness of breath described as severe, located in the chest, no exacerbating or alleviating factors.  Review of Systems  A complete 10 system review of systems was obtained and all systems are negative except as noted in the HPI and PMH.   Patient's Health History    Past Medical History:  Diagnosis Date  . Anemia   . Complication of anesthesia    Pt stated that his blood preesure must be monitored closely during any surgical procedure ( per neurologist)  . Diabetes mellitus without complication ([place])   . ESRD on dialysis ([place])   . Gout   . HLD (hyperlipidemia)   . Hyperparathyroidism due to renal insufficiency ([place])   . Hypertension   . Renal disorder     Past Surgical History:  Procedure Laterality Date  . AV FISTULA PLACEMENT Left 07/17/2015   Procedure: Left Upper Arm  ARTERIOVENOUS FISTULA CREATION;  Surgeon: [name], MD;  Location: [facility] OR;  Service: Vascular;  Laterality: Left;  . NO PAST SURGERIES      Family History  Problem Relation Age of Onset  . Diabetes Mellitus II Mother   . Kidney disease Mother   . Diabetes Mother   . Hypertension Mother   . CAD Father   . Heart disease Father   . Hypertension Father     Social History   Socioeconomic History  .  Marital status: Married    Spouse name: Not on file  . Number of children: Not on file  . Years of education: Not on file  . Highest education level: Not on file  Occupational History  . Not on file  Social Needs  . Financial resource strain: Not on file  . Food insecurity:    Worry: Not on file    Inability: Not on file  . Transportation needs:    Medical: Not on file    Non-medical: Not on file  Tobacco Use  . Smoking status: Never Smoker  . Smokeless tobacco: Never Used  Substance and Sexual Activity  . Alcohol use: No    Alcohol/week: 0.0 standard drinks  . Drug use: No  . Sexual activity: Not on file  Lifestyle  . Physical activity:    Days per week: Not on file    Minutes per session: Not on file  . Stress: Not on file  Relationships  . Social connections:    Talks on phone: Not on file    Gets together: Not on file    Attends religious service: Not on file    Active member of club or organization: Not on file    Attends meetings of clubs or organizations: Not on file    Relationship status: Not on file  . Intimate partner violence:    Fear of current or ex partner:  Not on file    Emotionally abused: Not on file    Physically abused: Not on file    Forced sexual activity: Not on file  Other Topics Concern  . Not on file  Social History Narrative  . Not on file     Physical Exam  Vital Signs and Nursing Notes reviewed Vitals:   07/26/18 1200 07/26/18 1230  BP: (!) 180/106 (!) 173/103  Pulse: 93 94  Resp: 19 17  Temp:    SpO2: 96% 96%    CONSTITUTIONAL: Well-appearing, NAD NEURO:  Alert and oriented x 3, no focal deficits EYES:  eyes equal and reactive ENT/NECK:  no LAD, no JVD CARDIO: Regular rate, well-perfused, normal S1 and S2 PULM: Scattered wheezes and rhonchi, increased work of breathing, tachypneic GI/GU:  normal bowel sounds, non-distended, non-tender MSK/SPINE:  No gross deformities, no edema SKIN:  no rash, atraumatic PSYCH:   Appropriate speech and behavior  Diagnostic and Interventional Summary    EKG Interpretation  Date/Time:  Sunday July 26 2018 08:51:33 EDT Ventricular Rate:  93 PR Interval:    QRS Duration: 94 QT Interval:  414 QTC Calculation: 515 R Axis:   33 Text Interpretation:  Sinus rhythm Probable anteroseptal infarct, old Nonspecific T abnormalities, lateral leads Prolonged QT interval Confirmed by Gerlene Fee [phone]) on 07/26/2018 10:02:57 AM      Labs Reviewed  CBC - Abnormal; Notable for the following components:      Result Value   RBC 3.22 (*)    Hemoglobin 8.5 (*)    HCT 25.9 (*)    RDW 16.5 (*)    All other components within normal limits  COMPREHENSIVE METABOLIC PANEL - Abnormal; Notable for the following components:   Potassium 5.8 (*)    CO2 20 (*)    BUN 69 (*)    Creatinine, Ser 10.68 (*)    Calcium 8.5 (*)    GFR calc non Af Amer 5 (*)    GFR calc Af Amer 6 (*)    Anion gap 18 (*)    All other components within normal limits  BRAIN NATRIURETIC PEPTIDE - Abnormal; Notable for the following components:   B Natriuretic Peptide 1,931.3 (*)    All other components within normal limits  TROPONIN I - Abnormal; Notable for the following components:   Troponin I 0.08 (*)    All other components within normal limits  I-STAT VENOUS BLOOD GAS, ED - Abnormal; Notable for the following components:   pCO2, Ven 32.3 (*)    Bicarbonate 19.7 (*)    TCO2 21 (*)    Acid-base deficit 5.0 (*)    All other components within normal limits  I-STAT CG4 LACTIC ACID, ED    DG Chest 2 View  Final Result      Medications  0.9 %  sodium chloride infusion ( Intravenous Stopped 07/26/18 1240)  albuterol (PROVENTIL) (2.5 MG/3ML) 0.083% nebulizer solution (2.5 mg  Given 07/26/18 0903)  ipratropium-albuterol (DUONEB) 0.5-2.5 (3) MG/3ML nebulizer solution (3 mLs  Given 07/26/18 0903)  vancomycin (VANCOCIN) IVPB 1000 mg/200 mL premix (0 mg Intravenous Stopped 07/26/18 1055)  levofloxacin  (LEVAQUIN) IVPB 500 mg (0 mg Intravenous Stopped 07/26/18 1240)  furosemide (LASIX) injection 20 mg (20 mg Intravenous Given 07/26/18 1022)     Procedures Critical Care Critical Care Documentation Critical care time provided by me (excluding procedures): 35 minutes  Condition necessitating critical care: Acute hypoxic respiratory failure, multifocal pneumonia  Components of critical care management: reviewing of prior  records, laboratory and imaging interpretation, frequent re-examination and reassessment of vital signs, administration of supplemental oxygen, IV antibiotics, IV Lasix, discussion with consulting services.   ED Course and Medical Decision Making  I have reviewed the triage vital signs and the nursing notes.  Pertinent labs & imaging results that were available during my care of the patient were reviewed by me and considered in my medical decision making (see below for details).    Hypoxic respiratory failure in this 52 year old male, ESRD, history of recurrent pneumonia recently.  O2 saturation 84% on arrival, in moderate respiratory distress.  Significant clinical improvement on oxygen, still with increased work of breathing.  PE considered, but patient explains that he had a CT of the chest while admitted which was negative for blood clots.  Will obtain basic work-up here and admit to hospital service at [facility].  Barth Kirks. Sedonia Small, MD [place] [email or web address]  Final Clinical Impressions(s) / ED Diagnoses     ICD-10-CM   1. Multifocal pneumonia J18.9   2. Acute respiratory failure with hypoxia [facility]) J96.01     ED Discharge Orders    None         Maudie Flakes, MD 07/26/18 1247

## 2018-07-26 NOTE — Progress Notes (Signed)
HD tx initiated via 15Gx2 w/o problem, pull/push/flush well w/o problem, VSS w/ very high bp, VO from Dr. Jonnie Finner for me to be able to run on UF profile 1 to try and get bp down, will cont to monitor while on HD tx

## 2018-07-26 NOTE — Progress Notes (Addendum)
ANTIBIOTIC CONSULT NOTE - INITIAL  Pharmacy Consult for cefepime/Vancomycin Indication: HCAP  Allergies  Allergen Reactions  . Lisinopril Hives  . Other Other (See Comments)    Itching and rash from "ultrasound gel"  . Penicillin G Rash  . Penicillins Hives  . Ultrasound Gel Itching and Rash    Patient Measurements: Height: 6\' 2"  (188 cm) Weight: 182 lb (82.6 kg) IBW/kg (Calculated) : 82.2   Vital Signs: Temp: 99 F (37.2 C) (09/08 1536) Temp Source: Axillary (09/08 1536) BP: 184/112 (09/08 1536) Pulse Rate: 97 (09/08 1536) Intake/Output from previous day: No intake/output data recorded. Intake/Output from this shift: Total I/O In: 458.2 [I.V.:7.8; IV Piggyback:450.4] Out: 300 [Urine:300]  Labs: Recent Labs    07/26/18 0907  WBC 10.0  HGB 8.5*  PLT 181  CREATININE 10.68*   Estimated Creatinine Clearance: 9.2 mL/min (A) (by C-G formula based on SCr of 10.68 mg/dL (H)). No results for input(s): VANCOTROUGH, VANCOPEAK, VANCORANDOM, GENTTROUGH, GENTPEAK, GENTRANDOM, TOBRATROUGH, TOBRAPEAK, TOBRARND, AMIKACINPEAK, AMIKACINTROU, AMIKACIN in the last 72 hours.   Microbiology: No results found for this or any previous visit (from the past 720 hour(s)).  Medical History: Past Medical History:  Diagnosis Date  . Anemia   . Complication of anesthesia    Pt stated that his blood preesure must be monitored closely during any surgical procedure ( per neurologist)  . Diabetes mellitus without complication ([place])   . ESRD on dialysis ([place])   . Gout   . HLD (hyperlipidemia)   . Hyperparathyroidism due to renal insufficiency ([place])   . Hypertension   . Renal disorder      Assessment: 52 yo male with h/o ESRD with presumed HCAP. Pharmacy consulted to dose cefepime and vancomycin. Will need to follow up with HD schedule and order further doses.   Plan:  Cefepime 1gm IV q24h adjusted for renal function.  Vancomycin  1750 mg IV once then 1G with HD Monitor for clinical  course, LOT, and C&S  Georga Bora, PharmD Clinical Pharmacist 07/26/2018 5:18 PM Please check AMION for all [place] numbers   Addendum Patient got 1 g vancomycin at [facility] prior to transfer. Therefore the 1750 mg dose that was ordered was discontinued. 1 g is inadequate load. Patient going for HD tonight -will give supplemental dose.  F/u remaining HD schedule   Harvel Quale 07/26/2018 8:36 PM

## 2018-07-26 NOTE — Consult Note (Signed)
Renal Service Consult Note [facility]  Toni Fodor 07/26/2018 [place] Requesting Physician:  Dr Cruzita Lederer  Reason for Consult:  ESRD pt w/ resp distress HPI: The patient is a 52 y.o. year-old with hx of HTN, gout, DM2, anemia and ESRD on HD TTS in [place]; has been on HD for about 2- 3 years.  Pt missed HD yesterday and developed SOB and came to ED at [facility].  He required nasal O2 at 3 lpm then required bipap at about 3 pm.  He has now been shipped over to [facility] on bipap, his breathing is much better.  CXR shows perihilar dense infiltrates on the R and less dense on the left.  No CP, no fevers or prod cough, no abd pain or nn/v/d.  Reportedly pt was in hospital for multifocal PNA in July and again last week, was just dc'd home on 9/6 from [facility] hospital.  He became SOB this time around 12 hrs prior to presenting.  BP's in the ED were high around 180/ 106.    Patient has no c/o at this time.  Temp 99 deg F and WBC 11k. He has rec'd IV abx x 2, nebulizers and ativan.  Lasix IV 20 mg x 1.     ROS  denies CP  no joint pain   no HA  no blurry vision  no rash  no diarrhea  no nausea/ vomiting  no dysuria  no difficulty voiding  no change in urine color    Past Medical History  Past Medical History:  Diagnosis Date  . Anemia   . Complication of anesthesia    Pt stated that his blood preesure must be monitored closely during any surgical procedure ( per neurologist)  . Diabetes mellitus without complication ([place])   . ESRD on dialysis ([place])   . Gout   . HLD (hyperlipidemia)   . Hyperparathyroidism due to renal insufficiency ([place])   . Hypertension   . Renal disorder    Past Surgical History  Past Surgical History:  Procedure Laterality Date  . AV FISTULA PLACEMENT Left 07/17/2015   Procedure: Left Upper Arm  ARTERIOVENOUS FISTULA CREATION;  Surgeon: [name], MD;  Location: [facility] OR;  Service: Vascular;  Laterality: Left;  . NO PAST SURGERIES      Family History  Family History  Problem Relation Age of Onset  . Diabetes Mellitus II Mother   . Kidney disease Mother   . Diabetes Mother   . Hypertension Mother   . CAD Father   . Heart disease Father   . Hypertension Father    Social History  reports that he has never smoked. He has never used smokeless tobacco. He reports that he does not drink alcohol or use drugs. Allergies  Allergies  Allergen Reactions  . Lisinopril Hives  . Other Other (See Comments)    Itching and rash from "ultrasound gel"  . Penicillin G Rash  . Penicillins Hives  . Ultrasound Gel Itching and Rash   Home medications Prior to Admission medications   Medication Sig Start Date End Date Taking? Authorizing Provider  amLODipine (NORVASC) 10 MG tablet Take 10 mg by mouth daily.    [provider]  aspirin 81 MG tablet Take 81 mg by mouth daily.    [provider]  atorvastatin (LIPITOR) 40 MG tablet Take 40 mg by mouth daily.    [provider]  carvedilol (COREG) 6.25 MG tablet Take 6.25 mg by  mouth 2 (two) times daily with a meal.    [provider]  Cholecalciferol (VITAMIN D3) 3000 UNITS TABS Take 1 tablet by mouth daily.    [provider]  colchicine 0.6 MG tablet Take 1 tablet (0.6 mg total) by mouth daily. Patient not taking: Reported on 10/29/2017 05/21/14   Jonetta Osgood, MD  febuxostat (ULORIC) 40 MG tablet Take 40 mg by mouth.    [provider]  insulin glargine (LANTUS) 100 UNIT/ML injection Inject 0.12 mLs (12 Units total) into the skin daily. At 10 am 05/21/14   Jonetta Osgood, MD  L-Methylfolate-B6-B12 (FOLTANX PO) Take 1 tablet by mouth daily.    [provider]  labetalol (NORMODYNE) 200 MG tablet  09/15/17   [provider]  mupirocin ointment (BACTROBAN) 2 % Place 1 application into the nose 3 (three) times daily.    [provider]  nateglinide (STARLIX) 120 MG tablet Take 120 mg by mouth 3  (three) times daily with meals.    [provider]  Pyridoxine HCl (VITAMIN B-6 PO) Take 1 tablet by mouth daily.    [provider]   Liver Function Tests Recent Labs  Lab 07/26/18 0907  AST 23  ALT 16  ALKPHOS 57  BILITOT 1.2  PROT 7.1  ALBUMIN 3.7   No results for input(s): LIPASE, AMYLASE in the last 168 hours. CBC Recent Labs  Lab 07/26/18 0907  WBC 10.0  HGB 8.5*  HCT 25.9*  MCV 80.4  PLT 127   Basic Metabolic Panel Recent Labs  Lab 07/26/18 0907  NA 137  K 5.8*  CL 99  CO2 20*  GLUCOSE 99  BUN 69*  CREATININE 10.68*  CALCIUM 8.5*   Iron/TIBC/Ferritin/ %Sat    Component Value Date/Time   IRON 45 05/20/2014 0347   TIBC 275 05/20/2014 0347   FERRITIN 88 05/20/2014 0347   IRONPCTSAT 16 (L) 05/20/2014 0347    Vitals:   07/26/18 1345 07/26/18 1511 07/26/18 1530 07/26/18 1536  BP:    (!) 184/112  Pulse: 97 90 99 97  Resp: (!) 24 (!) 30 (!) 23 (!) 35  Temp:    99 F (37.2 C)  TempSrc:    Axillary  SpO2: 93% 98% 97% 99%  Weight:      Height:       Exam Gen alert middle aged man, no distress on bipap No rash, cyanosis or gangrene Sclera anicteric, throat clear  +JVD Chest w/ basilar rales 1/3 up on R and 1/3 on L RRR no MRG Abd soft ntnd no mass or ascites +b GU normal male MS no joint effusions or deformity Ext 1-2+ bilat LE edema, no wounds or ulcers Neuro is alert, Ox 3 , nf    Home meds:  - amlodipine 10/ carvedilol 6.25 bid/ labetalol 200  - nateglinide 120 tid/ insulin glargine 12 u qd  - ecasa 81/ atorvastatin 40/ colchicine 0.6/ febuxostat 40 qd   Dialysis: TTS [facility]  4h  80kg  Hep none  LUA AVF   - hect 5 ug tiw  - no esa (last mircera 8/13)   Impression: 1. Acute resp distress - bilat severe infiltrates, has pulm edema appearance in my opinion. The last chest CT changes could also be due to perihilar pulm edema.  Would consider possibility that this is not recurrent multifocal PNA. Plan HD acutely this  evening asap.   2. ESRD - HD TTS. OP records pt has missed a lot of  HD recently but some of this may be due to hospitalization.  3. HTN - bp's up today w/ vol overload 4. Vol excess + edema on exam/ rales, etc 5. DM per primary 6. Gout   Plan - as above  Kelly Splinter MD [employer] pager [phone]   07/26/2018, 5:26 PM

## 2018-07-26 NOTE — ED Notes (Signed)
C/o of SOB, RT notified

## 2018-07-26 NOTE — Progress Notes (Signed)
ANTIBIOTIC CONSULT NOTE - INITIAL  Pharmacy Consult for cefepime Indication: HCAP  Allergies  Allergen Reactions  . Lisinopril Hives  . Other Other (See Comments)    Itching and rash from "ultrasound gel"  . Penicillin G Rash  . Penicillins Hives  . Ultrasound Gel Itching and Rash    Patient Measurements: Height: 6\' 2"  (188 cm) Weight: 182 lb (82.6 kg) IBW/kg (Calculated) : 82.2   Vital Signs: Temp: 99 F (37.2 C) (09/08 1536) Temp Source: Axillary (09/08 1536) BP: 184/112 (09/08 1536) Pulse Rate: 97 (09/08 1536) Intake/Output from previous day: No intake/output data recorded. Intake/Output from this shift: Total I/O In: 458.2 [I.V.:7.8; IV Piggyback:450.4] Out: 300 [Urine:300]  Labs: Recent Labs    07/26/18 0907  WBC 10.0  HGB 8.5*  PLT 181  CREATININE 10.68*   Estimated Creatinine Clearance: 9.2 mL/min (A) (by C-G formula based on SCr of 10.68 mg/dL (H)). No results for input(s): VANCOTROUGH, VANCOPEAK, VANCORANDOM, GENTTROUGH, GENTPEAK, GENTRANDOM, TOBRATROUGH, TOBRAPEAK, TOBRARND, AMIKACINPEAK, AMIKACINTROU, AMIKACIN in the last 72 hours.   Microbiology: No results found for this or any previous visit (from the past 720 hour(s)).  Medical History: Past Medical History:  Diagnosis Date  . Anemia   . Complication of anesthesia    Pt stated that his blood preesure must be monitored closely during any surgical procedure ( per neurologist)  . Diabetes mellitus without complication ([place])   . ESRD on dialysis ([employer])   . Gout   . HLD (hyperlipidemia)   . Hyperparathyroidism due to renal insufficiency ([place])   . Hypertension   . Renal disorder      Assessment: 52 yo male with h/o ESRD with presumed HCAP. Pharmacy consulted to dose cefepime.    Plan:  Cefepime 1gm IV q24h adjusted for renal function.  Monitor for clinical course, LOT, and C&S  Donald Alvarez A. Levada Dy, PharmD, [place] Pager: [phone] Please utilize Amion for  appropriate phone number to reach the unit pharmacist ([place])   07/26/2018,4:46 PM

## 2018-07-26 NOTE — H&P (Signed)
History and Physical    Donald Alvarez DZH:[id number] DOB: 1966-06-05 DOA: 07/26/2018  I have briefly reviewed the patient's prior medical records in [place]  PCP: [name], MD  Patient coming from: home  Chief Complaint: shortness of breath   HPI: Alok Minshall is a 52 y.o. male with medical history significant of ESRD in the setting of polycystic kidney disease, DM, HTN, who presented to [facility] ED with shortness of breath.  Patient was hospitalized at the end of July as well as last week in [facility] with multifocal pneumonia.  He was treated with antibiotics, improved and was discharged home.  Most recent discharge was just 2 days ago on 9/6. He was feeling OK at home however in the past 12h he became progressively short of breath. He states that over the past 2 months he feels like he never really got over this pneumonia. He denies fevers or chills however has been having a persistent productive cough. No chest pain, no abdominal pain, nausea or vomiting. He missed his HD yesterday due to being discharged late from the hospital. He underwent a CT scan at [place] on 9/2 which showed multifocal pneumonia as well as multiple small mediastinal and upper abdominal lymph nodes.  Pulmonology evaluated patient recommended further observation in an outpatient setting.  ED Course: In the ED patient was noted to be significantly hypoxic requiring BiPAP, VBG was reassuring, blood work remarkable for potassium of 5.8, BNP was elevated at 1900.  His troponin was 0.08.  Lactic acid was normal at 0.77.  We were asked to admit  Ct Chest Wo Contrast [place]  Result Date: 07/20/2018 CLINICAL DATA: Hemoptysis, productive cough EXAM: CT CHEST WITHOUT CONTRAST TECHNIQUE: Multidetector CT imaging of the chest was performed following the standard protocol without IV contrast. COMPARISON: Chest x-ray 07/20/2018, CT chest 06/09/2018 FINDINGS: Cardiovascular: Limited without  intravenous contrast. Mild aortic atherosclerosis. Ectatic ascending aorta up to 3.7 cm. Coronary vascular calcification. Normal heart size. No significant pericardial effusion Mediastinum/Nodes: Midline trachea. No thyroid mass. Prominent mediastinal lymph nodes. Right upper paratracheal lymph node measures 11 mm. AP window lymph nodes up to 8 mm. Esophagus within normal limits. Lungs/Pleura: Development of small bilateral pleural effusions. Left upper lobe pneumonia has largely resolved. New multifocal ground-glass densities and consolidations within the right upper lobe, lingula, right middle lobe and bilateral lower lobes concerning for multifocal pneumonia. Upper Abdomen: Partially visualized multi-cystic kidneys. Multiple cysts in the liver. Multiple prominent lymph nodes in the gastrohepatic region and porta hepatis. Musculoskeletal: No chest wall mass or suspicious bone lesions identified. IMPRESSION: 1. Interim development of small moderate pleural effusion. New multifocal ground-glass densities and consolidations throughout both lungs, concerning for multifocal pneumonia. The masslike area of consolidation in the left upper lobe noted on the 06/09/2018 CT has largely resolved. 2. Multiple small mediastinal and upper abdominal lymph nodes for which follow-up CT was previously recommended 3. Polycystic kidneys Aortic Atherosclerosis (ICD10-I70.0). Electronically Signed By: Donavan Foil M.D. On: 07/20/2018 20:12    Review of Systems: As per HPI otherwise 10 point review of systems negative.   Past Medical History:  Diagnosis Date  . Anemia   . Complication of anesthesia    Pt stated that his blood preesure must be monitored closely during any surgical procedure ( per neurologist)  . Diabetes mellitus without complication ([place])   . ESRD on dialysis ([place])   . Gout   . HLD (hyperlipidemia)   . Hyperparathyroidism due to renal insufficiency (  [place])   . Hypertension   . Renal disorder     Past  Surgical History:  Procedure Laterality Date  . AV FISTULA PLACEMENT Left 07/17/2015   Procedure: Left Upper Arm  ARTERIOVENOUS FISTULA CREATION;  Surgeon: [name], MD;  Location: [facility];  Service: Vascular;  Laterality: Left;  . NO PAST SURGERIES       reports that he has never smoked. He has never used smokeless tobacco. He reports that he does not drink alcohol or use drugs.  Allergies  Allergen Reactions  . Lisinopril Hives  . Other Other (See Comments)    Itching and rash from "ultrasound gel"  . Penicillin G Rash  . Penicillins Hives  . Ultrasound Gel Itching and Rash    Family History  Problem Relation Age of Onset  . Diabetes Mellitus II Mother   . Kidney disease Mother   . Diabetes Mother   . Hypertension Mother   . CAD Father   . Heart disease Father   . Hypertension Father     Prior to Admission medications   Medication Sig Start Date End Date Taking? Authorizing Provider  amLODipine (NORVASC) 10 MG tablet Take 10 mg by mouth daily.    [provider]  aspirin 81 MG tablet Take 81 mg by mouth daily.    [provider]  atorvastatin (LIPITOR) 40 MG tablet Take 40 mg by mouth daily.    [provider]  carvedilol (COREG) 6.25 MG tablet Take 6.25 mg by mouth 2 (two) times daily with a meal.    [provider]  Cholecalciferol (VITAMIN D3) 3000 UNITS TABS Take 1 tablet by mouth daily.    [provider]  colchicine 0.6 MG tablet Take 1 tablet (0.6 mg total) by mouth daily. Patient not taking: Reported on 10/29/2017 05/21/14   Jonetta Osgood, MD  febuxostat (ULORIC) 40 MG tablet Take 40 mg by mouth.    [provider]  insulin glargine (LANTUS) 100 UNIT/ML injection Inject 0.12 mLs (12 Units total) into the skin daily. At 10 am 05/21/14   Jonetta Osgood, MD  L-Methylfolate-B6-B12 (FOLTANX PO) Take 1 tablet by mouth daily.    [provider]  labetalol (NORMODYNE) 200 MG tablet  09/15/17   [provider]  mupirocin ointment (BACTROBAN) 2 % Place 1 application into the nose 3 (three) times daily.    [provider]  nateglinide (STARLIX) 120 MG tablet Take 120 mg by mouth 3 (three) times daily with meals.    [provider]  Pyridoxine HCl (VITAMIN B-6 PO) Take 1 tablet by mouth daily.    [provider]    Physical Exam: Vitals:   07/26/18 1345 07/26/18 1511 07/26/18 1530 07/26/18 1536  BP:    (!) 184/112  Pulse: 97 90 99 97  Resp: (!) 24 (!) 30 (!) 23 (!) 35  Temp:    99 F (37.2 C)  TempSrc:    Axillary  SpO2: 93% 98% 97% 99%  Weight:      Height:       Constitutional: NAD, calm, appears comfortable on BiPAP Eyes: PERRL, lids and conjunctivae normal ENMT: Mucous membranes are moist.  Respiratory: coarse breath sounds bilaterally, diminished at the bases, no wheezing  Cardiovascular: Regular rate and rhythm, no murmurs / rubs / gallops. No extremity edema. 2+ pedal pulses.  Abdomen: no tenderness, no masses palpated. Bowel sounds positive.  Musculoskeletal: no clubbing / cyanosis. Normal muscle tone.  Skin:  no rashes, lesions, ulcers. No induration Neurologic: CN 2-12 grossly intact. Strength 5/5 in all 4.  Psychiatric: Normal judgment and insight. Alert and oriented x 3. Normal mood.   Labs on Admission: I have personally reviewed following labs and imaging studies  CBC: Recent Labs  Lab 07/26/18 0907  WBC 10.0  HGB 8.5*  HCT 25.9*  MCV 80.4  PLT 098   Basic Metabolic Panel: Recent Labs  Lab 07/26/18 0907  NA 137  K 5.8*  CL 99  CO2 20*  GLUCOSE 99  BUN 69*  CREATININE 10.68*  CALCIUM 8.5*   GFR: Estimated Creatinine Clearance: 9.2 mL/min (A) (by C-G formula based on SCr of 10.68 mg/dL (H)). Liver Function Tests: Recent Labs  Lab 07/26/18 0907  AST 23  ALT 16  ALKPHOS 57  BILITOT 1.2  PROT 7.1  ALBUMIN 3.7   No results for input(s): LIPASE, AMYLASE in the last 168 hours. No results for input(s): AMMONIA  in the last 168 hours. Coagulation Profile: No results for input(s): INR, PROTIME in the last 168 hours. Cardiac Enzymes: Recent Labs  Lab 07/26/18 0907  TROPONINI 0.08*   BNP (last 3 results) No results for input(s): PROBNP in the last 8760 hours. HbA1C: No results for input(s): HGBA1C in the last 72 hours. CBG: No results for input(s): GLUCAP in the last 168 hours. Lipid Profile: No results for input(s): CHOL, HDL, LDLCALC, TRIG, CHOLHDL, LDLDIRECT in the last 72 hours. Thyroid Function Tests: No results for input(s): TSH, T4TOTAL, FREET4, T3FREE, THYROIDAB in the last 72 hours. Anemia Panel: No results for input(s): VITAMINB12, FOLATE, FERRITIN, TIBC, IRON, RETICCTPCT in the last 72 hours. Urine analysis:    Component Value Date/Time   COLORURINE YELLOW 05/19/2014 2008   APPEARANCEUR CLEAR 05/19/2014 2008   LABSPEC 1.010 05/19/2014 2008   PHURINE 6.0 05/19/2014 2008   GLUCOSEU NEGATIVE 05/19/2014 2008   HGBUR TRACE (A) 05/19/2014 2008   BILIRUBINUR NEGATIVE 05/19/2014 2008   [place] NEGATIVE 05/19/2014 2008   PROTEINUR 100 (A) 05/19/2014 2008   UROBILINOGEN 0.2 05/19/2014 2008   NITRITE NEGATIVE 05/19/2014 2008   LEUKOCYTESUR NEGATIVE 05/19/2014 2008     Radiological Exams on Admission: Dg Chest 2 View  Result Date: 07/26/2018 CLINICAL DATA:  Shortness of breath. Recently treated for pneumonia. EXAM: CHEST - 2 VIEW COMPARISON:  Chest CT 07/20/2018 FINDINGS: Persistent bilateral patchy airspace consolidation with central predominance. Small bilateral pleural effusions. The aeration of the lungs has worsened when compared to the chest CT dated 07/20/2018. Normal heart size. Calcific atherosclerotic disease of the aorta. IMPRESSION: Worsening bilateral patchy airspace consolidation with central predominance, most likely due to multifocal pneumonia. Small bilateral pleural effusions. Electronically Signed   By: Fidela Salisbury M.D.   On: 07/26/2018 09:56    EKG:  Independently reviewed. Sinus rhythm.   Assessment/Plan Active Problems:   PNA (pneumonia)   Pneumonia   Multifocal pneumonia / HCAP -Place on vancomycin and cefepime, he was recently hospitalized at the same and he was treated on vancomycin and cefepime then narrowed to Levaquin on discharge. -Send blood cultures, sputum cultures, check HIV status, strep pneumo and Legionella -Keep on IV antibiotics for at least 48 hours as he failed outpatient treatment  Acute hypoxic respiratory failure -Continue BiPAP, multifactorial due to multifocal pneumonia as well as a degree of fluid overload from his missed dialysis.  Antibiotics as above, nephrology consulted for dialysis  DM -SSI  HTN -continue home medications, hydralazine prn  ESRD -nephrology to see  DVT  prophylaxis: heparin  Code Status: Full code  Family Communication: no family at bedside Disposition Plan: home when ready  Consults called: nephrology     Admission status: inpatient   At the time of admission, it appears that the appropriate admission status for this patient is INPATIENT. This is judged to be reasonable and necessary in order to provide the required high service intensity to ensure the patient's safety given the presenting symptoms, physical exam findings, and initial radiographic and laboratory data in the context of their chronic comorbidities. Current circumstances are hypoxic respiratory failure, multifocal pneumonia failing outpatient antibiotics, and it is felt to place patient at high risk for further clinical deterioration threatening life, limb, or organ. Moreover, it is my clinical judgment that the patient will require inpatient hospital care spanning beyond 2 midnights from the point of admission and that early discharge would result in unnecessary risk of decompensation and readmission or threat to life, limb or bodily function.   Marzetta Board, MD [facility] Pager [phone]  If  7PM-7AM, please contact night-coverage [email or web address] Password [email or web address]  07/26/2018, 5:01 PM

## 2018-07-27 LAB — COMPREHENSIVE METABOLIC PANEL
ALBUMIN: 3.1 g/dL — AB (ref 3.5–5.0)
ALK PHOS: 50 U/L (ref 38–126)
ALT: 18 U/L (ref 0–44)
ANION GAP: 16 — AB (ref 5–15)
AST: 24 U/L (ref 15–41)
BUN: 27 mg/dL — ABNORMAL HIGH (ref 6–20)
CALCIUM: 8.4 mg/dL — AB (ref 8.9–10.3)
CHLORIDE: 95 mmol/L — AB (ref 98–111)
CO2: 25 mmol/L (ref 22–32)
Creatinine, Ser: 6.07 mg/dL — ABNORMAL HIGH (ref 0.61–1.24)
GFR calc Af Amer: 11 mL/min — ABNORMAL LOW (ref >60)
GFR calc non Af Amer: 9 mL/min — ABNORMAL LOW (ref >60)
GLUCOSE: 145 mg/dL — AB (ref 70–99)
Potassium: 4 mmol/L (ref 3.5–5.1)
SODIUM: 136 mmol/L (ref 135–145)
Total Bilirubin: 1.1 mg/dL (ref 0.3–1.2)
Total Protein: 6.3 g/dL — ABNORMAL LOW (ref 6.5–8.1)

## 2018-07-27 LAB — GLUCOSE, CAPILLARY
GLUCOSE-CAPILLARY: 100 mg/dL — AB (ref 70–99)
GLUCOSE-CAPILLARY: 108 mg/dL — AB (ref 70–99)
Glucose-Capillary: 97 mg/dL (ref 70–99)
Glucose-Capillary: 145 mg/dL — ABNORMAL HIGH (ref 70–99)

## 2018-07-27 LAB — LEGIONELLA PNEUMOPHILA SEROGP 1 UR AG: L. pneumophila Serogp 1 Ur Ag: NEGATIVE

## 2018-07-27 LAB — CBC
HCT: 27.3 % — ABNORMAL LOW (ref 39.0–52.0)
HEMOGLOBIN: 8.6 g/dL — AB (ref 13.0–17.0)
MCH: 25.6 pg — ABNORMAL LOW (ref 26.0–34.0)
MCHC: 31.5 g/dL (ref 30.0–36.0)
MCV: 81.3 fL (ref 78.0–100.0)
Platelets: 186 10*3/uL (ref 150–400)
RBC: 3.36 MIL/uL — AB (ref 4.22–5.81)
RDW: 16.1 % — ABNORMAL HIGH (ref 11.5–15.5)
WBC: 8.8 10*3/uL (ref 4.0–10.5)

## 2018-07-27 LAB — HIV ANTIBODY (ROUTINE TESTING W REFLEX): HIV SCREEN 4TH GENERATION: NONREACTIVE

## 2018-07-27 MED ORDER — HYDROCOD POLST-CPM POLST ER 10-8 MG/5ML PO SUER
5.0000 mL | Freq: Once | ORAL | Status: AC
  Administered 2018-07-27: 5 mL via ORAL
  Filled 2018-07-27: qty 5

## 2018-07-27 MED ORDER — AMLODIPINE BESYLATE 10 MG PO TABS
10.0000 mg | ORAL_TABLET | Freq: Every day | ORAL | Status: DC
  Administered 2018-07-27: 10 mg via ORAL
  Filled 2018-07-27: qty 1

## 2018-07-27 MED ORDER — RENA-VITE PO TABS
1.0000 | ORAL_TABLET | Freq: Every day | ORAL | Status: DC
  Administered 2018-07-27: 1 via ORAL
  Filled 2018-07-27: qty 1

## 2018-07-27 MED ORDER — INSULIN ASPART 100 UNIT/ML ~~LOC~~ SOLN
0.0000 [IU] | Freq: Three times a day (TID) | SUBCUTANEOUS | Status: DC
  Administered 2018-07-27: 1 [IU] via SUBCUTANEOUS
  Administered 2018-07-28: 5 [IU] via SUBCUTANEOUS

## 2018-07-27 MED ORDER — CHLORHEXIDINE GLUCONATE CLOTH 2 % EX PADS
6.0000 | MEDICATED_PAD | Freq: Every day | CUTANEOUS | Status: DC
  Administered 2018-07-27 – 2018-07-28 (×2): 6 via TOPICAL

## 2018-07-27 NOTE — Progress Notes (Signed)
Subjective: Interval History: knows that missing tx is contributing to problems  Objective: Vital signs in last 24 hours: Temp:  [98.1 F (36.7 C)-99.3 F (37.4 C)] 98.8 F (37.1 C) (09/09 0805) Pulse Rate:  [86-104] 93 (09/09 0805) Resp:  [14-35] 18 (09/09 0805) BP: (148-216)/(86-123) 163/97 (09/09 0805) SpO2:  [88 %-100 %] 95 % (09/09 0805) FiO2 (%):  [30 %] 30 % (09/08 2347) Weight:  [78.8 kg-85.3 kg] 78.8 kg (09/08 2331) Weight change:   Intake/Output from previous day: 09/08 0701 - 09/09 0700 In: 1038.2 [P.O.:240; I.V.:17.8; IV Piggyback:780.4] Out: 4024 [Urine:300] Intake/Output this shift: No intake/output data recorded.  General appearance: alert, cooperative and no distress Resp: rales bibasilar Cardio: S1, S2 normal and systolic murmur: systolic ejection 2/6, crescendo and decrescendo at 2nd left intercostal space GI: liver down 5 cm, soft Extremities: edema 1 + and AVF L FA  Lab Results: Recent Labs    07/26/18 1653 07/27/18 0106  WBC 11.4* 8.8  HGB 8.1* 8.6*  HCT 26.0* 27.3*  PLT 179 186   BMET:  Recent Labs    07/26/18 0907 07/26/18 1653 07/27/18 0106  NA 137  --  136  K 5.8*  --  4.0  CL 99  --  95*  CO2 20*  --  25  GLUCOSE 99  --  145*  BUN 69*  --  27*  CREATININE 10.68* 11.29* 6.07*  CALCIUM 8.5*  --  8.4*   No results for input(s): PTH in the last 72 hours. Iron Studies: No results for input(s): IRON, TIBC, TRANSFERRIN, FERRITIN in the last 72 hours.  Studies/Results: Dg Chest 2 View  Result Date: 07/26/2018 CLINICAL DATA:  Shortness of breath. Recently treated for pneumonia. EXAM: CHEST - 2 VIEW COMPARISON:  Chest CT 07/20/2018 FINDINGS: Persistent bilateral patchy airspace consolidation with central predominance. Small bilateral pleural effusions. The aeration of the lungs has worsened when compared to the chest CT dated 07/20/2018. Normal heart size. Calcific atherosclerotic disease of the aorta. IMPRESSION: Worsening bilateral patchy  airspace consolidation with central predominance, most likely due to multifocal pneumonia. Small bilateral pleural effusions. Electronically Signed   By: Fidela Salisbury M.D.   On: 07/26/2018 09:56    I have reviewed the patient's current medications.  Assessment/Plan: 1 ESRD vol xs. NONADHERENCE 2 Anemia esa, check Fe 3 HTN lower vol 4 NONADHERENCE primary issue 5 HPTH vit D P HD, esa, lower vol and meds     LOS: 1 day   Donald Alvarez 07/27/2018,8:19 AM

## 2018-07-27 NOTE — Progress Notes (Signed)
PROGRESS NOTE  Attending MD note  Patient was seen, examined,treatment plan was discussed with the PA-S.  I have personally reviewed the clinical findings, lab, imaging studies and management of this patient in detail. I agree with the documentation, as recorded by the PA-S  Patient is a 52 year old male with history of end-stage renal disease due to polycystic kidney disease, diabetes, hypertension who was admitted to the hospital on 9/8 with acute hypoxic respiratory failure thought to be due to multifocal pneumonia versus asymmetric pulmonary edema.  BP (!) 147/83   Pulse 86   Temp 99.6 F (37.6 C) (Oral)   Resp 18   Ht 6\' 2"  (1.88 m)   Wt 78.8 kg   SpO2 95%   BMI 22.30 kg/m  On Exam: Gen. exam: Awake, alert, not in any distress Chest: Good air entry bilaterally, no rhonchi or rales CVS: S1-S2 regular, no murmurs Abdomen: Soft, nontender and nondistended Neurology: Non-focal Skin: No rash or lesions  Plan  HCAP / multifocal PNA with hypoxic respiratory failure -Suspect he did have multifocal pneumonia in the past, I wonder at this point whether this is persistent infection versus asymmetric pulmonary edema. -For now keep on antibiotics while monitoring cultures, when he is dry post his HD tomorrow we will repeat the chest x-ray, hopefully this will give [place] a better idea -No longer requiring BiPAP, transfer to MedSurg  DM -CBGs well-controlled, continue sliding scale.  Per patient, he is not taking his Lantus, discontinue  HTN -closely monitor, he was discharged from [facility] on hydralazine, hold for now as his blood pressure was in the 130s this morning.  Hopefully with dialysis his blood pressure will stabilize  ESRD -renal following   Rest as below  Costin M. Cruzita Lederer, MD [facility] [phone]  If 7PM-7AM, please contact night-coverage [email or web address] Password [email or web address]    Donald Alvarez DGU:[id number] DOB: 1966-02-27 DOA: 07/26/2018 PCP: [name], MD   LOS: 1 day   Brief Narrative / Interim history: Donald Alvarez is a 52 y/o male with medical history significant of ESRD in the setting of polycystic kidney disease, DM type 2, HTN and anemia who presented to [facility] ED with SOB. He previously was hospitalized in July and last week for multifocal pneumonia and was treated with antibiotics. His condition improved and was discharged 9/6. He developed worsening SOB yesterday and went to the ED. CXR showed showed bilateral patchy airspace with central dominance suggesting multifocal pneumonia. He was admitted and started on IV antibiotics, nebulizers, and put on BiPAP. Nephrology has been consulted for ESRD and dialysis.   Subjective: Patient is feel better today and reports only mild cough. No fever or chills  Assessment & Plan: Active Problems:   PNA (pneumonia)   Pneumonia   Multifocal pneumonia -Continue vancomycin and cefepime, patient to have CXR following dialysis  -Blood and sputum cultured taken, awaiting results.  Acute hypoxic respiratory failure -Continue BiPAP and continue antibiotics for suspected pneumonia and dialysis for fluid overload DM -Continue sliding scale insulin Hypertension -Continue home BP meds and hydralazine PRN, BP has improved ESRD, polycystic kidney disease -Nephrology consulted, believe patients symptoms could be pulmanary edema versus pneumonia based on CT and CXR -HD this evening and have CXR immediately following to determine pulmanary edema vs. Multifocal pneumonia   Scheduled Meds: . amLODipine  10 mg Oral QHS  . aspirin EC  81 mg Oral Daily  . atorvastatin  40 mg Oral Daily  . carvedilol  6.25 mg Oral BID WC  .  Chlorhexidine Gluconate Cloth  6 each Topical Q0600  . Chlorhexidine Gluconate Cloth  6 each Topical Q0600  . heparin  5,000 Units Subcutaneous Q8H  . insulin aspart  0-9 Units Subcutaneous TID WC  . insulin glargine  12 Units Subcutaneous Daily  . multivitamin  1  tablet Oral QHS  . sodium chloride flush  3 mL Intravenous Q12H  . vancomycin variable dose per unstable renal function (pharmacist dosing)   Does not apply See admin instructions   Continuous Infusions: . sodium chloride Stopped (07/26/18 1240)  . sodium chloride    . sodium chloride    . ceFEPime (MAXIPIME) IV    . ceFEPime (MAXIPIME) IV     PRN Meds:.sodium chloride, sodium chloride, sodium chloride, hydrALAZINE, lidocaine-prilocaine, pentafluoroprop-tetrafluoroeth, sodium chloride flush  DVT prophylaxis: heparin Code Status: full code Family Communication: none at bedside Disposition Plan: home when clinically ready  Consultants:   Nephrology  Procedures:   2D echo: none  Foley: none  BiPAP: 07/26/2018   HD: 07/27/2018  Antimicrobials:  Vancomycin, cefepime  Objective: Vitals:   07/27/18 0300 07/27/18 0400 07/27/18 0500 07/27/18 0805  BP: (!) 149/87 (!) 154/96 (!) 153/92 (!) 163/97  Pulse: 90   93  Resp: 20   18  Temp: 99.3 F (37.4 C)   98.8 F (37.1 C)  TempSrc: Oral   Oral  SpO2: 97% 97% 97% 95%  Weight:      Height:        Intake/Output Summary (Last 24 hours) at 07/27/2018 1020 Last data filed at 07/27/2018 0400 Gross per 24 hour  Intake 1038.21 ml  Output 4024 ml  Net -2985.79 ml   Filed Weights   07/26/18 1721 07/26/18 1920 07/26/18 2331  Weight: 85.3 kg 82.5 kg 78.8 kg    Examination:  Constitutional: laying comfortable in bed, NAD Respiratory: Normal respiratory effort, no wheezing. Decreased breath sounds Cardiovascular: RRR, no murmurs, no LE edema Abdomen: nontender to palpation Musculoskeletal: no clubbing or cyanosis Skin: no rashes, lesions, ulcers Psychiatric: Normal affect and mood, alert and oriented x3    Data Reviewed: I have independently reviewed following labs and imaging studies   CBC: Recent Labs  Lab 07/26/18 0907 07/26/18 1653 07/27/18 0106  WBC 10.0 11.4* 8.8  HGB 8.5* 8.1* 8.6*  HCT 25.9* 26.0* 27.3*  MCV  80.4 83.1 81.3  PLT 181 179 161   Basic Metabolic Panel: Recent Labs  Lab 07/26/18 0907 07/26/18 1653 07/27/18 0106  NA 137  --  136  K 5.8*  --  4.0  CL 99  --  95*  CO2 20*  --  25  GLUCOSE 99  --  145*  BUN 69*  --  27*  CREATININE 10.68* 11.29* 6.07*  CALCIUM 8.5*  --  8.4*   GFR: Estimated Creatinine Clearance: 15.5 mL/min (A) (by C-G formula based on SCr of 6.07 mg/dL (H)). Liver Function Tests: Recent Labs  Lab 07/26/18 0907 07/27/18 0106  AST 23 24  ALT 16 18  ALKPHOS 57 50  BILITOT 1.2 1.1  PROT 7.1 6.3*  ALBUMIN 3.7 3.1*   No results for input(s): LIPASE, AMYLASE in the last 168 hours. No results for input(s): AMMONIA in the last 168 hours. Coagulation Profile: No results for input(s): INR, PROTIME in the last 168 hours. Cardiac Enzymes: Recent Labs  Lab 07/26/18 0907  TROPONINI 0.08*   BNP (last 3 results) No results for input(s): PROBNP in the last 8760 hours. HbA1C: No results for input(s): HGBA1C in  the last 72 hours. CBG: Recent Labs  Lab 07/26/18 2356 07/27/18 0805  GLUCAP 100* 97   Lipid Profile: No results for input(s): CHOL, HDL, LDLCALC, TRIG, CHOLHDL, LDLDIRECT in the last 72 hours. Thyroid Function Tests: No results for input(s): TSH, T4TOTAL, FREET4, T3FREE, THYROIDAB in the last 72 hours. Anemia Panel: No results for input(s): VITAMINB12, FOLATE, FERRITIN, TIBC, IRON, RETICCTPCT in the last 72 hours. Urine analysis:    Component Value Date/Time   COLORURINE YELLOW 05/19/2014 2008   APPEARANCEUR CLEAR 05/19/2014 2008   LABSPEC 1.010 05/19/2014 2008   PHURINE 6.0 05/19/2014 2008   GLUCOSEU NEGATIVE 05/19/2014 2008   HGBUR TRACE (A) 05/19/2014 2008   BILIRUBINUR NEGATIVE 05/19/2014 2008   [place] NEGATIVE 05/19/2014 2008   PROTEINUR 100 (A) 05/19/2014 2008   UROBILINOGEN 0.2 05/19/2014 2008   NITRITE NEGATIVE 05/19/2014 2008   LEUKOCYTESUR NEGATIVE 05/19/2014 2008   Sepsis Labs: Invalid input(s): PROCALCITONIN,  LACTICIDVEN  Recent Results (from the past 240 hour(s))  Culture, blood (routine x 2) Call MD if unable to obtain prior to antibiotics being given     Status: None (Preliminary result)   Collection Time: 07/26/18  7:15 PM  Result Value Ref Range Status   Specimen Description BLOOD RIGHT HAND  Final   Special Requests   Final    BOTTLES DRAWN AEROBIC AND ANAEROBIC Blood Culture adequate volume   Culture   Final    NO GROWTH < 12 HOURS Performed at [facility], [address], [place], [place] [address]    Report Status PENDING  Incomplete      Radiology Studies: Dg Chest 2 View  Result Date: 07/26/2018 CLINICAL DATA:  Shortness of breath. Recently treated for pneumonia. EXAM: CHEST - 2 VIEW COMPARISON:  Chest CT 07/20/2018 FINDINGS: Persistent bilateral patchy airspace consolidation with central predominance. Small bilateral pleural effusions. The aeration of the lungs has worsened when compared to the chest CT dated 07/20/2018. Normal heart size. Calcific atherosclerotic disease of the aorta. IMPRESSION: Worsening bilateral patchy airspace consolidation with central predominance, most likely due to multifocal pneumonia. Small bilateral pleural effusions. Electronically Signed   By: Fidela Salisbury M.D.   On: 07/26/2018 09:56      Marzetta Board, MD, PhD [facility] Pager [phone]  If 7PM-7AM, please contact night-coverage [email or web address] Password [email or web address] 07/27/2018, 10:20 AM

## 2018-07-27 NOTE — Plan of Care (Signed)
Discussed with patient plan of care for the evening, pain management and obtaining medication for cough to be able to rest tonight with some teach back displayed

## 2018-07-27 NOTE — Progress Notes (Signed)
HD tx completed @ 2300 w/o problems w/ tx other than UF off intermittently for cramping and high bp that didn't respond to HD fluid removal or prn hydralazine admin. Pt also had one episode of productive coughing w/ moderate amt of thick green sputum. UF goal not met d/t UF off intermittently for cramping, VSS w/ really high bp still, report called to Wynonia Hazard, RN

## 2018-07-28 ENCOUNTER — Inpatient Hospital Stay (HOSPITAL_COMMUNITY): Payer: Medicare Other

## 2018-07-28 DIAGNOSIS — J810 Acute pulmonary edema: Secondary | ICD-10-CM

## 2018-07-28 DIAGNOSIS — N186 End stage renal disease: Secondary | ICD-10-CM

## 2018-07-28 LAB — RENAL FUNCTION PANEL
Albumin: 2.9 g/dL — ABNORMAL LOW (ref 3.5–5.0)
Anion gap: 19 — ABNORMAL HIGH (ref 5–15)
BUN: 66 mg/dL — ABNORMAL HIGH (ref 6–20)
CHLORIDE: 97 mmol/L — AB (ref 98–111)
CO2: 22 mmol/L (ref 22–32)
CREATININE: 10.09 mg/dL — AB (ref 0.61–1.24)
Calcium: 8.1 mg/dL — ABNORMAL LOW (ref 8.9–10.3)
GFR calc Af Amer: 6 mL/min — ABNORMAL LOW (ref >60)
GFR, EST NON AFRICAN AMERICAN: 5 mL/min — AB (ref >60)
Glucose, Bld: 91 mg/dL (ref 70–99)
POTASSIUM: 5.3 mmol/L — AB (ref 3.5–5.1)
Phosphorus: 9.7 mg/dL — ABNORMAL HIGH (ref 2.5–4.6)
Sodium: 138 mmol/L (ref 135–145)

## 2018-07-28 LAB — EXPECTORATED SPUTUM ASSESSMENT W REFEX TO RESP CULTURE

## 2018-07-28 LAB — EXPECTORATED SPUTUM ASSESSMENT W GRAM STAIN, RFLX TO RESP C

## 2018-07-28 LAB — CBC
HEMATOCRIT: 24.9 % — AB (ref 39.0–52.0)
HEMOGLOBIN: 7.8 g/dL — AB (ref 13.0–17.0)
MCH: 26 pg (ref 26.0–34.0)
MCHC: 31.3 g/dL (ref 30.0–36.0)
MCV: 83 fL (ref 78.0–100.0)
PLATELETS: 202 10*3/uL (ref 150–400)
RBC: 3 MIL/uL — ABNORMAL LOW (ref 4.22–5.81)
RDW: 15.7 % — ABNORMAL HIGH (ref 11.5–15.5)
WBC: 7.8 10*3/uL (ref 4.0–10.5)

## 2018-07-28 LAB — VANCOMYCIN, RANDOM: VANCOMYCIN RM: 21

## 2018-07-28 LAB — GLUCOSE, CAPILLARY
GLUCOSE-CAPILLARY: 91 mg/dL (ref 70–99)
Glucose-Capillary: 228 mg/dL — ABNORMAL HIGH (ref 70–99)

## 2018-07-28 MED ORDER — LIDOCAINE HCL (PF) 1 % IJ SOLN: 5.0000 mL | INTRAMUSCULAR | Status: DC | PRN

## 2018-07-28 MED ORDER — DARBEPOETIN ALFA 200 MCG/0.4ML IJ SOSY
PREFILLED_SYRINGE | INTRAMUSCULAR | Status: AC
  Administered 2018-07-28: 200 ug via INTRAVENOUS
  Filled 2018-07-28: qty 0.4

## 2018-07-28 MED ORDER — HEPARIN SODIUM (PORCINE) 1000 UNIT/ML DIALYSIS: 100.0000 [IU]/kg | INTRAMUSCULAR | Status: DC | PRN

## 2018-07-28 MED ORDER — VANCOMYCIN HCL IN DEXTROSE 750-5 MG/150ML-% IV SOLN
INTRAVENOUS | Status: AC
  Administered 2018-07-28: 750 mg via INTRAVENOUS
  Filled 2018-07-28: qty 150

## 2018-07-28 MED ORDER — LIDOCAINE-PRILOCAINE 2.5-2.5 % EX CREA: 1.0000 "application " | TOPICAL_CREAM | CUTANEOUS | Status: DC | PRN

## 2018-07-28 MED ORDER — SODIUM CHLORIDE 0.9 % IV SOLN: 100.0000 mL | INTRAVENOUS | Status: DC | PRN

## 2018-07-28 MED ORDER — PENTAFLUOROPROP-TETRAFLUOROETH EX AERO: 1.0000 "application " | INHALATION_SPRAY | CUTANEOUS | Status: DC | PRN

## 2018-07-28 MED ORDER — HEPARIN SODIUM (PORCINE) 1000 UNIT/ML DIALYSIS: 1000.0000 [IU] | INTRAMUSCULAR | Status: DC | PRN

## 2018-07-28 MED ORDER — ACETAMINOPHEN 325 MG PO TABS
ORAL_TABLET | ORAL | Status: AC
  Administered 2018-07-28: 650 mg
  Filled 2018-07-28: qty 2

## 2018-07-28 MED ORDER — VANCOMYCIN HCL IN DEXTROSE 750-5 MG/150ML-% IV SOLN
750.0000 mg | INTRAVENOUS | Status: DC
  Administered 2018-07-28: 750 mg via INTRAVENOUS

## 2018-07-28 MED ORDER — ALTEPLASE 2 MG IJ SOLR: 2.0000 mg | Freq: Once | INTRAMUSCULAR | Status: DC | PRN

## 2018-07-28 MED ORDER — DARBEPOETIN ALFA 200 MCG/0.4ML IJ SOSY
200.0000 ug | PREFILLED_SYRINGE | INTRAMUSCULAR | Status: DC
  Administered 2018-07-28: 200 ug via INTRAVENOUS

## 2018-07-28 NOTE — Progress Notes (Signed)
Subjective: Interval History: has no complaint .  Objective: Vital signs in last 24 hours: Temp:  [97.9 F (36.6 C)-99.6 F (37.6 C)] (P) 97.9 F (36.6 C) (09/10 0800) Pulse Rate:  [80-90] (P) 81 (09/10 0830) Resp:  [18-19] (P) 19 (09/10 0800) BP: (133-164)/(78-101) (P) 160/100 (09/10 0830) SpO2:  [83 %-97 %] (P) 94 % (09/10 0800) Weight:  [77.8 kg-79.3 kg] (P) 77.8 kg (09/10 0800) Weight change: -3.255 kg  Intake/Output from previous day: 09/09 0701 - 09/10 0700 In: 350 [P.O.:240; I.V.:10; IV Piggyback:100] Out: 225 [Urine:225] Intake/Output this shift: No intake/output data recorded.  General appearance: alert, cooperative and no distress Resp: rales bibasilar Cardio: S1, S2 normal and systolic murmur: systolic ejection 2/6, crescendo and decrescendo at 2nd left intercostal space GI: soft, non-tender; bowel sounds normal; no masses,  no organomegaly Extremities: AVF UE  Lab Results: Recent Labs    07/27/18 0106 07/28/18 0832  WBC 8.8 7.8  HGB 8.6* 7.8*  HCT 27.3* 24.9*  PLT 186 202   BMET:  Recent Labs    07/27/18 0106 07/28/18 0833  NA 136 138  K 4.0 5.3*  CL 95* 97*  CO2 25 22  GLUCOSE 145* 91  BUN 27* 66*  CREATININE 6.07* 10.09*  CALCIUM 8.4* 8.1*   No results for input(s): PTH in the last 72 hours. Iron Studies: No results for input(s): IRON, TIBC, TRANSFERRIN, FERRITIN in the last 72 hours.  Studies/Results: Dg Chest 2 View  Result Date: 07/26/2018 CLINICAL DATA:  Shortness of breath. Recently treated for pneumonia. EXAM: CHEST - 2 VIEW COMPARISON:  Chest CT 07/20/2018 FINDINGS: Persistent bilateral patchy airspace consolidation with central predominance. Small bilateral pleural effusions. The aeration of the lungs has worsened when compared to the chest CT dated 07/20/2018. Normal heart size. Calcific atherosclerotic disease of the aorta. IMPRESSION: Worsening bilateral patchy airspace consolidation with central predominance, most likely due to  multifocal pneumonia. Small bilateral pleural effusions. Electronically Signed   By: Fidela Salisbury M.D.   On: 07/26/2018 09:56    I have reviewed the patient's current medications.  Assessment/Plan: 1 ESRD for HD. 2 HTN meds and lower vol 3 Anemia esa  4 HPTH P HD, lower vol, esa   LOS: 2 days   Donald Alvarez 07/28/2018,9:20 AM

## 2018-07-28 NOTE — Procedures (Signed)
I was present at this session.  I have reviewed the session itself and made appropriate changes.  HD via UA AVF, bp ^, to get 4 L off. Access press ok.  Jeneen Rinks Remy Dia 9/10/20199:19 AM

## 2018-07-28 NOTE — Progress Notes (Signed)
Pharmacy Antibiotic Note  Donald Alvarez is a 52 y.o. male admitted on 07/26/2018 with SOB concerning for PNA vs volume overload. Pharmacy has been consulted for Vancomycin and Cefepime dosing.  The patient's pre-HD Vancomycin random level this morning was therapeutic (VR 21 mcg/ml, goal of 15-25 mcg/ml). Outpatient schedule was TTS - will assume the patient to stay on schedule.   Plan: - Vancomycin 750 mg IV post HD-TTS - Cefepime 1g IV every 24 hours - Will continue to follow HD schedule/duration, culture results, LOT, and antibiotic de-escalation plans   Height: 6\' 2"  (188 cm) Weight: 171 lb 8.3 oz (77.8 kg) IBW/kg (Calculated) : 82.2  Temp (24hrs), Avg:98.7 F (37.1 C), Min:97.9 F (36.6 C), Max:99.6 F (37.6 C)  Recent Labs  Lab 07/26/18 0907 07/26/18 0919 07/26/18 1653 07/27/18 0106 07/28/18 0832 07/28/18 0833  WBC 10.0  --  11.4* 8.8 7.8  --   CREATININE 10.68*  --  11.29* 6.07*  --  10.09*  LATICACIDVEN  --  0.77  --   --   --   --   VANCORANDOM  --   --   --   --   --  21    Estimated Creatinine Clearance: 9.2 mL/min (A) (by C-G formula based on SCr of 10.09 mg/dL (H)).    Allergies  Allergen Reactions  . Lisinopril Hives  . Other Other (See Comments)    Itching and rash from "ultrasound gel"  . Penicillin G Rash  . Penicillins Hives  . Ultrasound Gel Itching and Rash    Antimicrobials this admission: Vanc 9/8 >> Cefepime 9/8 >>  Dose adjustments this admission: 9/10 Pre-HD VR: 21 mcg/ml - at goal  Microbiology results: 9/9 BCx x 2 >> 9/8 RCx >>  Thank you for allowing pharmacy to be a part of this patient's care.  Alycia Rossetti, PharmD, BCPS Clinical Pharmacist Pager: [phone] Clinical phone for 07/28/2018 from 7a-3:30p: [phone] If after 3:30p, please call main pharmacy at: [phone] Please check AMION for all [facility] numbers 07/28/2018 10:25 AM

## 2018-07-28 NOTE — Discharge Instructions (Signed)
Follow with [name], MD in 5-7 days  Please get a complete blood count and chemistry panel checked by your Primary MD at your next visit, and again as instructed by your Primary MD. Please get your medications reviewed and adjusted by your Primary MD.  Please request your Primary MD to go over all Hospital Tests and Procedure/Radiological results at the follow up, please get all Hospital records sent to your Prim MD by signing hospital release before you go home.  If you had Pneumonia of Lung problems at the Hospital: Please get a 2 view Chest X ray done in 6-8 weeks after hospital discharge or sooner if instructed by your Primary MD.  If you have Congestive Heart Failure: Please call your Cardiologist or Primary MD anytime you have any of the following symptoms:  1) 3 pound weight gain in 24 hours or 5 pounds in 1 week  2) shortness of breath, with or without a dry hacking cough  3) swelling in the hands, feet or stomach  4) if you have to sleep on extra pillows at night in order to breathe  Follow cardiac low salt diet and 1.5 lit/day fluid restriction.  If you have diabetes Accuchecks 4 times/day, Once in AM empty stomach and then before each meal. Log in all results and show them to your primary doctor at your next visit. If any glucose reading is under 80 or above 300 call your primary MD immediately.  If you have Seizure/Convulsions/Epilepsy: Please do not drive, operate heavy machinery, participate in activities at heights or participate in high speed sports until you have seen by Primary MD or a Neurologist and advised to do so again.  If you had Gastrointestinal Bleeding: Please ask your Primary MD to check a complete blood count within one week of discharge or at your next visit. Your endoscopic/colonoscopic biopsies that are pending at the time of discharge, will also need to followed by your Primary MD.  Get Medicines reviewed and adjusted. Please take all your  medications with you for your next visit with your Primary MD  Please request your Primary MD to go over all hospital tests and procedure/radiological results at the follow up, please ask your Primary MD to get all Hospital records sent to his/her office.  If you experience worsening of your admission symptoms, develop shortness of breath, life threatening emergency, suicidal or homicidal thoughts you must seek medical attention immediately by calling 911 or calling your MD immediately  if symptoms less severe.  You must read complete instructions/literature along with all the possible adverse reactions/side effects for all the Medicines you take and that have been prescribed to you. Take any new Medicines after you have completely understood and accpet all the possible adverse reactions/side effects.   Do not drive or operate heavy machinery when taking Pain medications.   Do not take more than prescribed Pain, Sleep and Anxiety Medications  Special Instructions: If you have smoked or chewed Tobacco  in the last 2 yrs please stop smoking, stop any regular Alcohol  and or any Recreational drug use.  Wear Seat belts while driving.  Please note You were cared for by a hospitalist during your hospital stay. If you have any questions about your discharge medications or the care you received while you were in the hospital after you are discharged, you can call the unit and asked to speak with the hospitalist on call if the hospitalist that took care of you is not available. Once  you are discharged, your primary care physician will handle any further medical issues. Please note that NO REFILLS for any discharge medications will be authorized once you are discharged, as it is imperative that you return to your primary care physician (or establish a relationship with a primary care physician if you do not have one) for your aftercare needs so that they can reassess your need for medications and monitor your  lab values.  You can reach the hospitalist office at phone [phone] or fax [phone]   If you do not have a primary care physician, you can call [phone] for a physician referral.  Activity: As tolerated with Full fall precautions use walker/cane & assistance as needed  Diet: renal  Disposition Home

## 2018-07-28 NOTE — Discharge Summary (Signed)
Physician Discharge Summary  Donald Alvarez MVE:[id number] DOB: May 29, 1966 DOA: 07/26/2018  PCP: [name], MD  Admit date: 07/26/2018 Discharge date: 07/28/2018  Admitted From: home Disposition:  home  Recommendations for Outpatient Follow-up:  1. Follow up with PCP in 1-2 weeks 2. HD outpatient as scheduled   Home Health: none Equipment/Devices: none  Discharge Condition: stable CODE STATUS: Full code Diet recommendation: renal   HPI:  Donald Alvarez is a 52 y.o. male with medical history significant of ESRD in the setting of polycystic kidney disease, DM, HTN, who presented to [facility] ED with shortness of breath.  Patient was hospitalized at the end of July as well as last week in [facility] with multifocal pneumonia.  He was treated with antibiotics, improved and was discharged home.  Most recent discharge was just 2 days ago on 9/6. He was feeling OK at home however in the past 12h he became progressively short of breath. He states that over the past 2 months he feels like he never really got over this pneumonia. He denies fevers or chills however has been having a persistent productive cough. No chest pain, no abdominal pain, nausea or vomiting. He missed his HD yesterday due to being discharged late from the hospital. He underwent a CT scan at [place] on 9/2 which showed multifocal pneumonia as well as multiple small mediastinal and upper abdominal lymph nodes.  Pulmonology evaluated patient in [place] recommended further observation in an outpatient setting.  Prior to this hospital stay he missed hemodialysis the day before admission  Hospital Course: Hypoxic respiratory failure, concerning for multifocal pneumonia versus asymmetric edema -patient with history of multifocal pneumonia which has been treated about a month ago.  Over the last month he has been having recurrent pneumonia for at least couple of times during hospitalizations in [facility].  Given  end-stage renal disease nephrology was consulted, and based on imaging and on the fact that patient is noncompliant with his dialysis and outpatient it is possible that this represents asymmetric edema.  Nevertheless, he was placed on broad-spectrum and IV antibiotics and cultures were obtained. He underwent hemodialysis x2 with a total of 7 L removal within 2 days (Sun evening and Tue morning), and repeat chest x-ray showed almost complete resolution of his "pneumonia", suggesting asymmetric pulmonary edema and less likely infectious process.  His cultures have remained negative.  He is feeling back to baseline after dialysis, and will be discharged home in stable condition and was advised for compliance with outpatient HD  Imaging pre- and post-HD    DM -resume home medications HTN -BP stable, continue home medications ESRD -renal following, HDx2 while hospitalized.   Discharge Diagnoses:  Active Problems:   PNA (pneumonia)   Pneumonia   Discharge Instructions   Allergies as of 07/28/2018      Reactions   Lisinopril Hives   Other Other (See Comments)   Itching and rash from "ultrasound gel"   Penicillin G Rash   Penicillins Hives   Ultrasound Gel Itching, Rash      Medication List    TAKE these medications   amLODipine 10 MG tablet Commonly known as:  NORVASC Take 10 mg by mouth daily.   aspirin 81 MG tablet Take 81 mg by mouth daily.   atorvastatin 40 MG tablet Commonly known as:  LIPITOR Take 40 mg by mouth daily.   carvedilol 6.25 MG tablet Commonly known as:  COREG Take 6.25 mg by mouth 2 (two)  times daily with a meal.   febuxostat 40 MG tablet Commonly known as:  ULORIC Take 40 mg by mouth.   FOLTANX PO Take 1 tablet by mouth daily.   mupirocin ointment 2 % Commonly known as:  BACTROBAN Place 1 application into the nose 3 (three) times daily.   nateglinide 120 MG tablet Commonly known as:  STARLIX Take 60 mg by mouth 3 (three) times daily with  meals.   Vitamin D3 3000 units Tabs Take 1 tablet by mouth daily.      Follow-up Information    Osei-Bonsu, Iona Beard, MD. Schedule an appointment as soon as possible for a visit in 1 week(s).   Specialty:  Internal Medicine Contact information: [address] [place] [address] [phone]           Consultations:  Nephrology   Procedures/Studies:  HD x 2  Dg Chest 2 View  Result Date: 07/28/2018 CLINICAL DATA:  Pneumonia. EXAM: CHEST - 2 VIEW COMPARISON:  Radiographs of July 26, 2018. FINDINGS: The heart size and mediastinal contours are within normal limits. Small bilateral pleural effusions are noted. No pneumothorax is noted. Bilateral lung opacities noted on prior exam are significantly decreased suggesting improving pneumonia or edema. The visualized skeletal structures are unremarkable. IMPRESSION: Significantly improved bilateral pneumonia or edema. Small bilateral pleural effusions are again noted. Electronically Signed   By: Marijo Conception, M.D.   On: 07/28/2018 15:39   Dg Chest 2 View  Result Date: 07/26/2018 CLINICAL DATA:  Shortness of breath. Recently treated for pneumonia. EXAM: CHEST - 2 VIEW COMPARISON:  Chest CT 07/20/2018 FINDINGS: Persistent bilateral patchy airspace consolidation with central predominance. Small bilateral pleural effusions. The aeration of the lungs has worsened when compared to the chest CT dated 07/20/2018. Normal heart size. Calcific atherosclerotic disease of the aorta. IMPRESSION: Worsening bilateral patchy airspace consolidation with central predominance, most likely due to multifocal pneumonia. Small bilateral pleural effusions. Electronically Signed   By: Fidela Salisbury M.D.   On: 07/26/2018 09:56      Subjective: - no chest pain, shortness of breath, no abdominal pain, nausea or vomiting.   Discharge Exam: Vitals:   07/28/18 1200 07/28/18 1218  BP: (!) 142/89 (!) 164/98  Pulse: 80 82  Resp:  16   Temp:  (!) 97.2 F (36.2 C)  SpO2:  94%    General: Pt is alert, awake, not in acute distress Cardiovascular: RRR, S1/S2 +, no rubs, no gallops Respiratory: CTA bilaterally, no wheezing, no rhonchi Abdominal: Soft, NT, ND, bowel sounds + Extremities: no edema, no cyanosis    The results of significant diagnostics from this hospitalization (including imaging, microbiology, ancillary and laboratory) are listed below for reference.     Microbiology: Recent Results (from the past 240 hour(s))  Culture, blood (routine x 2) Call MD if unable to obtain prior to antibiotics being given     Status: None (Preliminary result)   Collection Time: 07/26/18  7:15 PM  Result Value Ref Range Status   Specimen Description BLOOD RIGHT HAND  Final   Special Requests   Final    BOTTLES DRAWN AEROBIC AND ANAEROBIC Blood Culture adequate volume   Culture   Final    NO GROWTH 2 DAYS Performed at [facility], [address] [place], [place] [address]    Report Status PENDING  Incomplete  Culture, sputum-assessment     Status: None   Collection Time: 07/26/18 11:22 PM  Result Value Ref Range  Status   Specimen Description EXPECTORATED SPUTUM  Final   Special Requests NONE  Final   Sputum evaluation   Final    Sputum specimen not acceptable for testing.  Please recollect.   RESULT CALLED TO, READ BACK BY AND VERIFIED WITHKathrynn Running RN [id number] 07/28/18 A BROWNING Performed at [facility], [place] [address] [place], [place] [address]    Report Status 07/28/2018 FINAL  Final  Culture, blood (Routine X 2) w Reflex to ID Panel     Status: None (Preliminary result)   Collection Time: 07/27/18  1:06 AM  Result Value Ref Range Status   Specimen Description BLOOD RIGHT HAND  Final   Special Requests   Final    BOTTLES DRAWN AEROBIC ONLY Blood Culture adequate volume   Culture   Final    NO GROWTH 1 DAY Performed at [facility], [place] [address], [place], [place] [address]    Report  Status PENDING  Incomplete     Labs: BNP (last 3 results) Recent Labs    07/26/18 0907  BNP 6,503.5*   Basic Metabolic Panel: Recent Labs  Lab 07/26/18 0907 07/26/18 1653 07/27/18 0106 07/28/18 0833  NA 137  --  136 138  K 5.8*  --  4.0 5.3*  CL 99  --  95* 97*  CO2 20*  --  25 22  GLUCOSE 99  --  145* 91  BUN 69*  --  27* 66*  CREATININE 10.68* 11.29* 6.07* 10.09*  CALCIUM 8.5*  --  8.4* 8.1*  PHOS  --   --   --  9.7*   Liver Function Tests: Recent Labs  Lab 07/26/18 0907 07/27/18 0106 07/28/18 0833  AST 23 24  --   ALT 16 18  --   ALKPHOS 57 50  --   BILITOT 1.2 1.1  --   PROT 7.1 6.3*  --   ALBUMIN 3.7 3.1* 2.9*   No results for input(s): LIPASE, AMYLASE in the last 168 hours. No results for input(s): AMMONIA in the last 168 hours. CBC: Recent Labs  Lab 07/26/18 0907 07/26/18 1653 07/27/18 0106 07/28/18 0832  WBC 10.0 11.4* 8.8 7.8  HGB 8.5* 8.1* 8.6* 7.8*  HCT 25.9* 26.0* 27.3* 24.9*  MCV 80.4 83.1 81.3 83.0  PLT 181 179 186 202   Cardiac Enzymes: Recent Labs  Lab 07/26/18 0907  TROPONINI 0.08*   BNP: Invalid input(s): POCBNP CBG: Recent Labs  Lab 07/26/18 2356 07/27/18 0805 07/27/18 1132 07/27/18 1625 07/28/18 0745  GLUCAP 100* 97 145* 108* 91   D-Dimer No results for input(s): DDIMER in the last 72 hours. Hgb A1c No results for input(s): HGBA1C in the last 72 hours. Lipid Profile No results for input(s): CHOL, HDL, LDLCALC, TRIG, CHOLHDL, LDLDIRECT in the last 72 hours. Thyroid function studies No results for input(s): TSH, T4TOTAL, T3FREE, THYROIDAB in the last 72 hours.  Invalid input(s): FREET3 Anemia work up No results for input(s): VITAMINB12, FOLATE, FERRITIN, TIBC, IRON, RETICCTPCT in the last 72 hours. Urinalysis    Component Value Date/Time   COLORURINE YELLOW 05/19/2014 2008   APPEARANCEUR CLEAR 05/19/2014 2008   LABSPEC 1.010 05/19/2014 2008   PHURINE 6.0 05/19/2014 2008   GLUCOSEU NEGATIVE 05/19/2014 2008    HGBUR TRACE (A) 05/19/2014 2008   BILIRUBINUR NEGATIVE 05/19/2014 2008   [place] NEGATIVE 05/19/2014 2008   PROTEINUR 100 (A) 05/19/2014 2008   UROBILINOGEN 0.2 05/19/2014 2008   NITRITE NEGATIVE 05/19/2014 2008  LEUKOCYTESUR NEGATIVE 05/19/2014 2008   Sepsis Labs Invalid input(s): PROCALCITONIN,  WBC,  LACTICIDVEN    Time coordinating discharge: 45 minutes  SIGNED:  Marzetta Board, MD  [facility] 07/28/2018, 4:12 PM Pager [phone]  If 7PM-7AM, please contact night-coverage [email or web address] Password [email or web address]

## 2018-07-28 NOTE — Progress Notes (Signed)
Patient refused lab draw this AM, he prefers they be drawn in dialysis.  Talked to dialysis at [phone] to let them know

## 2018-07-29 LAB — GLUCOSE, CAPILLARY
GLUCOSE-CAPILLARY: 106 mg/dL — AB (ref 70–99)
Glucose-Capillary: 72 mg/dL (ref 70–99)

## 2018-07-31 LAB — CULTURE, BLOOD (ROUTINE X 2)
Culture: NO GROWTH
SPECIAL REQUESTS: ADEQUATE

## 2018-08-01 LAB — CULTURE, BLOOD (ROUTINE X 2)
Culture: NO GROWTH
Special Requests: ADEQUATE

## 2018-11-27 ENCOUNTER — Ambulatory Visit (HOSPITAL_BASED_OUTPATIENT_CLINIC_OR_DEPARTMENT_OTHER)
Admission: RE | Admit: 2018-11-27 | Discharge: 2018-11-27 | Disposition: A | Discharge status: Home / Self Care | Payer: Medicare Other | Source: Ambulatory Visit | Attending: Nephrology | Admitting: Nephrology

## 2018-11-27 ENCOUNTER — Other Ambulatory Visit (HOSPITAL_BASED_OUTPATIENT_CLINIC_OR_DEPARTMENT_OTHER): Payer: Self-pay | Admitting: Nephrology

## 2018-11-27 DIAGNOSIS — R053 Chronic cough: Secondary | ICD-10-CM

## 2018-11-27 DIAGNOSIS — R05 Cough: Secondary | ICD-10-CM | POA: Insufficient documentation

## 2018-12-11 ENCOUNTER — Ambulatory Visit (INDEPENDENT_AMBULATORY_CARE_PROVIDER_SITE_OTHER): Payer: Medicare Other | Admitting: Pulmonary Disease

## 2018-12-11 ENCOUNTER — Encounter: Payer: Self-pay | Admitting: Pulmonary Disease

## 2018-12-11 DIAGNOSIS — R05 Cough: Secondary | ICD-10-CM

## 2018-12-11 DIAGNOSIS — J471 Bronchiectasis with (acute) exacerbation: Secondary | ICD-10-CM

## 2018-12-11 DIAGNOSIS — R053 Chronic cough: Secondary | ICD-10-CM | POA: Insufficient documentation

## 2018-12-11 NOTE — Assessment & Plan Note (Signed)
Unclear etiology at this time.  He does not have overt postnasal drip or reflux but this could be possibilities. Chest x-ray is clear.  His prior presentation last year seems to have been due to pneumonia versus fluid overload but resolved with dialysis and indicating that this was likely fluid overload. He has mild pedal edema at present but seems to be well dialyzed otherwise. I reviewed all his prior CT imaging and there seems to be some suggestion of bronchiectasis.  He does not give a history of prior pneumonias or childhood illness.  We will pursue high-resolution CT chest to see if there is any evidence of dialysis now that he has a clear chest x-ray. He also seems to have a loud P2 and we may pursue an echo to rule out valvular heart disease. Otherwise, I will hold off further testing and evaluate him further if his cough returns.  I do not feel that he needs to take his Flovent and can use albuterol on an as-needed basis only

## 2018-12-11 NOTE — Patient Instructions (Signed)
  Cough may be related to postnasal drip or reflux or fluid buildup. High-resolution CT of the chest to rule out bronchiectasis  Call me back if cough returns and we can do further testing as needed

## 2018-12-11 NOTE — Progress Notes (Signed)
Subjective:    Patient ID: Donald Alvarez, male    DOB: 12-09-1965, 53 y.o.   MRN: [id number]  HPI  Chief Complaint  Patient presents with  . Pulm Consult    Referred by Dr. Edrick Oh for persistent cough for the past year. Semi productive cough.     53 year old never smoker with ESRD on dialysis presents for evaluation of cough and wheezing. He is of Asian origin is from [place] originally but has lived in [place] for 20 years He has polycystic kidney disease and has been on dialysis for 3 years.  He is also listed for transplant. Cough started 05/2018 initially and was associated with shortness of breath and left upper lobe infiltrate noted on chest x-ray and CT scan.  He was treated as healthcare associated pneumonia at [facility].  I have reviewed all his imaging studies. CT chest from 06/09/2018 shows masslike consolidation in the left upper lobe. He then had symptoms in 07/2018 and CT chest then showed resolution of this masslike consolidation but bilateral multifocal patchy infiltrates, he was treated for multifocal pneumonia. He was then also hospitalized at [facility] on 07/26/2018 with chest x-ray showing bilateral consolidation versus edema, but resolution in 2 days after dialysis suggesting that this was all related to pulmonary edema/fluid overload  Since then cough resolved but has returned in the past few weeks.  He reports dry cough minimally productive of clear white sputum and intermittent wheezing especially when he lies on his left side.  Wheezing is both inspiratory as well as expiratory. Chest x-ray was obtained 11/27/2018 which shows no evidence of edema or consolidation.  He appears to be well dialyzed otherwise via left upper extremity AV fistula.  He denies postnasal drip or significant reflux symptoms or seasonal allergies. Blood pressure is well controlled on 3 medications.   He is now on disability since starting on dialysis, previously worked  as a Lexicographer for [employer]   Past Medical History:  Diagnosis Date  . Anemia   . Complication of anesthesia    Pt stated that his blood preesure must be monitored closely during any surgical procedure ( per neurologist)  . Diabetes mellitus without complication ([place])   . ESRD on dialysis ([place])   . Gout   . HLD (hyperlipidemia)   . Hyperparathyroidism due to renal insufficiency ([place])   . Hypertension   . Renal disorder    Past Surgical History:  Procedure Laterality Date  . AV FISTULA PLACEMENT Left 07/17/2015   Procedure: Left Upper Arm  ARTERIOVENOUS FISTULA CREATION;  Surgeon: [name], MD;  Location: [facility] OR;  Service: Vascular;  Laterality: Left;  . NO PAST SURGERIES       Allergies  Allergen Reactions  . Lisinopril Hives  . Other Other (See Comments)    Itching and rash from "ultrasound gel"  . Ultrasound Gel Itching and Rash    Social History   Socioeconomic History  . Marital status: Married    Spouse name: Not on file  . Number of children: Not on file  . Years of education: Not on file  . Highest education level: Not on file  Occupational History  . Not on file  Social Needs  . Financial resource strain: Not on file  . Food insecurity:    Worry: Not on file    Inability: Not on file  . Transportation needs:    Medical: Not on file    Non-medical: Not on file  Tobacco Use  . Smoking status: Never Smoker  . Smokeless tobacco: Never Used  Substance and Sexual Activity  . Alcohol use: No    Alcohol/week: 0.0 standard drinks  . Drug use: No  . Sexual activity: Not on file  Lifestyle  . Physical activity:    Days per week: Not on file    Minutes per session: Not on file  . Stress: Not on file  Relationships  . Social connections:    Talks on phone: Not on file    Gets together: Not on file    Attends religious service: Not on file    Active member of club or organization: Not on file    Attends meetings of clubs or organizations:  Not on file    Relationship status: Not on file  . Intimate partner violence:    Fear of current or ex partner: Not on file    Emotionally abused: Not on file    Physically abused: Not on file    Forced sexual activity: Not on file  Other Topics Concern  . Not on file  Social History Narrative  . Not on file     Family History  Problem Relation Age of Onset  . Diabetes Mellitus II Mother   . Kidney disease Mother   . Diabetes Mother   . Hypertension Mother   . CAD Father   . Heart disease Father   . Hypertension Father       Review of Systems Constitutional: negative for anorexia, fevers and sweats  Eyes: negative for irritation, redness and visual disturbance  Ears, nose, mouth, throat, and face: negative for earaches, epistaxis, nasal congestion and sore throat  Respiratory: negative for  dyspnea on exertion, sputum and wheezing  Cardiovascular: negative for chest pain, dyspnea, lower extremity edema, orthopnea, palpitations and syncope  Gastrointestinal: negative for abdominal pain, constipation, diarrhea, melena, nausea and vomiting  Genitourinary:negative for dysuria, frequency and hematuria  Hematologic/lymphatic: negative for bleeding, easy bruising and lymphadenopathy  Musculoskeletal:negative for arthralgias, muscle weakness and stiff joints  Neurological: negative for coordination problems, gait problems, headaches and weakness  Endocrine: negative for diabetic symptoms including polydipsia, polyuria and weight loss     Objective:   Physical Exam  Gen. Pleasant, well-nourished, in no distress, normal affect ENT - no pallor,icterus, no post nasal drip Neck: No JVD, no thyromegaly, no carotid bruits Lungs: no use of accessory muscles, no dullness to percussion, clear without rales or rhonchi  Cardiovascular: Rhythm regular, heart sounds  normal, no murmurs or gallops, 1+ peripheral edema Abdomen: soft and non-tender, no hepatosplenomegaly, BS  normal. Musculoskeletal: No deformities, no cyanosis or clubbing Neuro:  alert, non focal       Assessment & Plan:

## 2018-12-14 ENCOUNTER — Ambulatory Visit (HOSPITAL_BASED_OUTPATIENT_CLINIC_OR_DEPARTMENT_OTHER)
Admission: RE | Admit: 2018-12-14 | Discharge: 2018-12-14 | Disposition: A | Discharge status: Home / Self Care | Payer: Medicare Other | Source: Ambulatory Visit | Attending: Pulmonary Disease | Admitting: Pulmonary Disease

## 2018-12-14 DIAGNOSIS — J471 Bronchiectasis with (acute) exacerbation: Secondary | ICD-10-CM | POA: Diagnosis present

## 2019-09-16 ENCOUNTER — Ambulatory Visit (INDEPENDENT_AMBULATORY_CARE_PROVIDER_SITE_OTHER): Payer: Medicare Other

## 2019-09-16 ENCOUNTER — Other Ambulatory Visit: Payer: Self-pay

## 2019-09-16 ENCOUNTER — Telehealth: Payer: Self-pay | Admitting: Pulmonary Disease

## 2019-09-16 ENCOUNTER — Ambulatory Visit (INDEPENDENT_AMBULATORY_CARE_PROVIDER_SITE_OTHER): Payer: Medicare Other | Admitting: Pulmonary Disease

## 2019-09-16 ENCOUNTER — Encounter: Payer: Self-pay | Admitting: Pulmonary Disease

## 2019-09-16 DIAGNOSIS — R05 Cough: Secondary | ICD-10-CM

## 2019-09-16 DIAGNOSIS — N186 End stage renal disease: Secondary | ICD-10-CM

## 2019-09-16 DIAGNOSIS — R053 Chronic cough: Secondary | ICD-10-CM

## 2019-09-16 DIAGNOSIS — Z992 Dependence on renal dialysis: Secondary | ICD-10-CM | POA: Diagnosis not present

## 2019-09-16 MED ORDER — FLUTTER DEVI: 1.0000 | 0 refills | Status: DC | PRN

## 2019-09-16 MED ORDER — AMOXICILLIN-POT CLAVULANATE 875-125 MG PO TABS: 1.0000 | ORAL_TABLET | Freq: Two times a day (BID) | ORAL | 0 refills | Status: DC

## 2019-09-16 NOTE — Telephone Encounter (Signed)
Patient states he is getting short of breath with activity and sometimes just sitting still. He is usually 95-97% when he checks it. He is having a productive cough with white/clear sputum. Patient was seen by Dr. Elsworth Soho before and patient states he was told it was pneumonia and holding onto fluid.  Patient wanted to be seen by Dr. Elsworth Soho, so he was scheduled for 09/22/19 with RA but also wanted to talk to a NP today to see about cough relief. Televisit scheduled with BPM at 1030. Nothing further needed at this time

## 2019-09-16 NOTE — Assessment & Plan Note (Signed)
Plan: Sputum cultures today Patient present to our office with for a chest x-ray Empirically will treat with Augmentin as we await sputum cultures Flutter valve to start use today

## 2019-09-16 NOTE — Progress Notes (Signed)
Virtual Visit via Telephone Note  I connected with Donald Alvarez on 09/16/19 at 10:30 AM EDT by telephone and verified that I am speaking with the correct person using two identifiers.  Location: Patient: Home Provider: [facility] - [id number], [place], [place], [place] [address]   I discussed the limitations, risks, security and privacy concerns of performing an evaluation and management service by telephone and the availability of in person appointments. I also discussed with the patient that there may be a patient responsible charge related to this service. The patient expressed understanding and agreed to proceed.  Patient consented to consult via telephone: Yes People present and their role in pt care: Pt   History of Present Illness:  53 year old male never smoker followed in our office for chronic cough  Past medical history:Neuropathy, hypertension, diabetes,polycystic kidney disease - end-stage renal disease on dialysis, hyperlipidemia Smoking history: Never smoker Maintenance: none Patient of Dr. Elsworth Soho  Chief complaint: Shortness of Breath, mucous    54 year old male never smoker seen in our office on 12/11/2018 for suspected bronchiectasis by Dr. Elsworth Soho.  Patient had a high-resolution CT chest that did show some postinfectious or inflammatory scarring but did not show bronchiectasis.    Of note patient is a HD patient due to polycystic kidney disease.  Patient reports that for the last 2 weeks he had increased shortness of breath, wheezing and cough.  He was recently treated with azithromycin and symptoms did get better while on the antibiotics but returned soon as he finished the antibiotics.  He has not had sputum cultures or a chest x-ray.  Patient reports that he is continue to follow-up with hemodialysis and they have been reaching his dry weights.  He reports that weights are stable.  He does have increased shortness of breath, occasional wheezing as  well as a cough that worsens at night.  The cough is variable sometimes dry and sometimes productive.  He does feel like it is more productive in the morning or at night.  Sometimes he hears a rattling in his lungs where he feels that he is congested.  He remains afebrile.  He does not have a flutter valve.    Cough ROS:   When to the symptoms start: flare of chronic problem  How are you today: ok   Have you had fever/sore throat (first 5 to 7 days of URI) or Have you had cough/nasal congestion (10 to 14 days of URI) : dry or prod cough  Have you used anything to treat the cough, as anything improved : zpak, pred  Is it a dry or wet cough: both Does the cough happen when your breathing or when you breathe out: not sure Other any triggers to your cough, or any aggravating factors: at night, lying down, early in the mornings   Daily antihistamine: none  GERD treatment: none  Singulair: none   Cough checklist (bolded indicates presence):  Adherence, acid reflux, ACE inhibitor, active sinus disease, active smoking, adverse effects of medications (amiodarone/Macrodantin/bb), alpha 1, allergies, aspiration, anxiety, bronchiectasis, congestive heart failure (diastolic)   Observations/Objective:  09/16/2019 - weight - 75kg   12/14/2018-CT chest high-res-no evidence of fibrotic interstitial lung disease, air trapping is indicative of small airways disease, very mild scattered postinfectious postinflammatory scarring in lungs, enlarged pulmonary arteries  04/21/2019-pharmacological stress echo-, LV ejection fraction 35 to 40% with stress, positive dobutamine echocardiogram for inducible ischemia  Assessment and Plan:  Chronic cough Plan: Sputum cultures today Patient  present to our office with for a chest x-ray Empirically will treat with Augmentin as we await sputum cultures Flutter valve to start use today   ESRD on dialysis [facility]) Plan: Continue dialysis   Follow Up  Instructions:  Return in about 6 weeks (around 10/28/2019), or if symptoms worsen or fail to improve, for Follow up with Dr. Elsworth Soho.   I discussed the assessment and treatment plan with the patient. The patient was provided an opportunity to ask questions and all were answered. The patient agreed with the plan and demonstrated an understanding of the instructions.   The patient was advised to call back or seek an in-person evaluation if the symptoms worsen or if the condition fails to improve as anticipated.  I provided 24 minutes of non-face-to-face time during this encounter.   Lauraine Rinne, NP

## 2019-09-16 NOTE — Progress Notes (Signed)
No acute pneumonia.  Minimal pulmonary edema.  Likely some fluid in the lung vasculature.  Please make sure you continue follow-up with hemodialysis.  Please notify them that we do the chest x-ray as you have had some bouts of shortness of breath to see if they may be out of pull off additional fluid.  Continue take the Augmentin as prescribed.  Start using flutter valve.  Proceed forward with sputum cultures.  Keep close follow-up in our office as planned.  Wyn Quaker, FNP

## 2019-09-16 NOTE — Assessment & Plan Note (Signed)
Plan: Continue dialysis

## 2019-09-16 NOTE — Patient Instructions (Addendum)
You were seen today by Lauraine Rinne, NP  for:   1. Chronic cough  Sputum cultures today > AFB, fungal, respiratory sputum  - DG Chest 2 View; Future  - amoxicillin-clavulanate (AUGMENTIN) 875-125 MG tablet; Take 1 tablet by mouth 2 (two) times daily.  Dispense: 14 tablet; Refill: 0  Start using a flutter valve 10 breaths twice a day or 4 to 5 breaths 4-5 times a day to help clear mucus out  Let us know if you have cough with change in mucus color or fevers or chills.  At that point you would need an antibiotic. Maintain a healthy nutritious diet, eating whole foods Take your medications as prescribed    We recommend today:  Orders Placed This Encounter  Procedures  . DG Chest 2 View    Standing Status:   Future    Standing Expiration Date:   11/15/2020    Order Specific Question:   Reason for Exam (SYMPTOM  OR DIAGNOSIS REQUIRED)    Answer:   shortness of breath, HD patient    Order Specific Question:   Preferred imaging location?    Answer:   Internal    Order Specific Question:   Radiology Contrast Protocol - do NOT remove file path    Answer:   [email or web address]   Orders Placed This Encounter  Procedures  . DG Chest 2 View   Meds ordered this encounter  Medications  . amoxicillin-clavulanate (AUGMENTIN) 875-125 MG tablet    Sig: Take 1 tablet by mouth 2 (two) times daily.    Dispense:  14 tablet    Refill:  0    Follow Up:    Return in about 6 weeks (around 10/28/2019), or if symptoms worsen or fail to improve, for Follow up with Dr. Elsworth Soho.   Please do your part to reduce the spread of COVID-19:      Reduce your risk of any infection  and COVID19 by using the similar precautions used for avoiding the common cold or flu:  Marland Kitchen Wash your hands often with soap and warm water for at least 20 seconds.  If soap and water are not readily available, use an alcohol-based hand sanitizer with at least 60% alcohol.  . If coughing or  sneezing, cover your mouth and nose by coughing or sneezing into the elbow areas of your shirt or coat, into a tissue or into your sleeve (not your hands). Langley Gauss A MASK when in public  . Avoid shaking hands with others and consider head nods or verbal greetings only. . Avoid touching your eyes, nose, or mouth with unwashed hands.  . Avoid close contact with people who are sick. . Avoid places or events with large numbers of people in one location, like concerts or sporting events. . If you have some symptoms but not all symptoms, continue to monitor at home and seek medical attention if your symptoms worsen. . If you are having a medical emergency, call 911.   [place] / e-Visit: [email or web address]         [facility]: [facility]: [id number]                   [facility]: [id number]     It is flu season:   >>> Best ways to protect herself from the flu: Receive the yearly flu vaccine, practice good hand hygiene washing with soap and also using  hand sanitizer when available, eat a nutritious meals, get adequate rest, hydrate appropriately   Please contact the office if your symptoms worsen or you have concerns that you are not improving.   Thank you for choosing  [facility] for your healthcare, and for allowing [place] to partner with you on your healthcare journey. I am thankful to be able to provide care to you today.   Wyn Quaker FNP-C

## 2019-09-17 ENCOUNTER — Other Ambulatory Visit: Payer: Medicare Other

## 2019-09-17 DIAGNOSIS — R05 Cough: Secondary | ICD-10-CM

## 2019-09-17 DIAGNOSIS — R053 Chronic cough: Secondary | ICD-10-CM

## 2019-09-21 ENCOUNTER — Telehealth: Payer: Self-pay | Admitting: Pulmonary Disease

## 2019-09-21 NOTE — Progress Notes (Signed)
See previous message B

## 2019-09-21 NOTE — Telephone Encounter (Signed)
Called and spoke with Patient.  Results and recommendations given. Understanding stated.   Patient scheduled with Dr. Elsworth Soho 09/22/19. Nothing further at this time.

## 2019-09-21 NOTE — Progress Notes (Signed)
See previous telephone note.  Looks like AFB and fungal are currently being processed.  Respiratory sputum sample was canceled due to high number of epithelial cells in sputum.  If patient's symptoms are not improving then this can be evaluated tomorrow at office visit with Dr. Elsworth Soho.  Can consider reculturing respiratory sputum at that point in time if clinically necessary.  Wyn Quaker, FNP

## 2019-09-21 NOTE — Telephone Encounter (Signed)
09/21/2019 0854  We have received notification from [facility] labs that the patient sputum sample contain 25 or more squamous epithelial cells which found unacceptable for culture due to oropharyngeal contamination.   Patient needs to keep follow-up with our office with Dr. Elsworth Soho.  If patient has not clinically improved despite previous interventions I would recommend that we consider reculturing sputum to get a more adequate sample.  Wyn Quaker, FNP

## 2019-09-22 ENCOUNTER — Ambulatory Visit: Payer: Medicare Other | Admitting: Pulmonary Disease

## 2019-10-15 LAB — FUNGUS CULTURE W SMEAR
MICRO NUMBER:: 1049162
SMEAR:: NONE SEEN
SPECIMEN QUALITY:: ADEQUATE

## 2019-10-15 LAB — RESPIRATORY CULTURE OR RESPIRATORY AND SPUTUM CULTURE
MICRO NUMBER:: 1049163
SPECIMEN QUALITY:: ADEQUATE

## 2019-11-02 LAB — AFB CULTURE WITH SMEAR (NOT AT ARMC)
Acid Fast Culture: NEGATIVE
Acid Fast Smear: NEGATIVE

## 2020-01-29 DIAGNOSIS — T7840XA Allergy, unspecified, initial encounter: Secondary | ICD-10-CM | POA: Insufficient documentation

## 2020-03-01 DIAGNOSIS — R202 Paresthesia of skin: Secondary | ICD-10-CM | POA: Insufficient documentation

## 2020-03-01 DIAGNOSIS — R200 Anesthesia of skin: Secondary | ICD-10-CM | POA: Insufficient documentation

## 2020-03-01 DIAGNOSIS — B351 Tinea unguium: Secondary | ICD-10-CM | POA: Insufficient documentation

## 2020-03-01 DIAGNOSIS — M2041 Other hammer toe(s) (acquired), right foot: Secondary | ICD-10-CM | POA: Insufficient documentation

## 2020-03-01 DIAGNOSIS — M2042 Other hammer toe(s) (acquired), left foot: Secondary | ICD-10-CM | POA: Insufficient documentation

## 2020-08-21 DIAGNOSIS — Z4802 Encounter for removal of sutures: Secondary | ICD-10-CM | POA: Insufficient documentation

## 2020-10-17 ENCOUNTER — Other Ambulatory Visit (HOSPITAL_COMMUNITY): Payer: Self-pay | Admitting: Internal Medicine

## 2020-10-17 ENCOUNTER — Other Ambulatory Visit (HOSPITAL_BASED_OUTPATIENT_CLINIC_OR_DEPARTMENT_OTHER): Payer: Self-pay | Admitting: Internal Medicine

## 2020-10-17 DIAGNOSIS — R0609 Other forms of dyspnea: Secondary | ICD-10-CM

## 2020-10-17 DIAGNOSIS — R0600 Dyspnea, unspecified: Secondary | ICD-10-CM

## 2020-10-18 ENCOUNTER — Ambulatory Visit (HOSPITAL_BASED_OUTPATIENT_CLINIC_OR_DEPARTMENT_OTHER): Admission: RE | Admit: 2020-10-18 | Payer: Medicare Other | Source: Ambulatory Visit

## 2020-11-29 ENCOUNTER — Ambulatory Visit (HOSPITAL_BASED_OUTPATIENT_CLINIC_OR_DEPARTMENT_OTHER)
Admission: RE | Admit: 2020-11-29 | Discharge: 2020-11-29 | Disposition: A | Discharge status: Home / Self Care | Payer: Medicare Other | Source: Ambulatory Visit | Attending: Internal Medicine | Admitting: Internal Medicine

## 2020-11-29 ENCOUNTER — Other Ambulatory Visit (HOSPITAL_BASED_OUTPATIENT_CLINIC_OR_DEPARTMENT_OTHER): Payer: Self-pay | Admitting: Internal Medicine

## 2020-11-29 ENCOUNTER — Other Ambulatory Visit: Payer: Self-pay

## 2020-11-29 DIAGNOSIS — R0600 Dyspnea, unspecified: Secondary | ICD-10-CM | POA: Insufficient documentation

## 2020-11-29 DIAGNOSIS — R0609 Other forms of dyspnea: Secondary | ICD-10-CM

## 2020-11-29 LAB — ECHOCARDIOGRAM COMPLETE
Area-P 1/2: 3.02 cm2
S' Lateral: 3.35 cm

## 2020-12-22 ENCOUNTER — Encounter (HOSPITAL_COMMUNITY): Payer: Self-pay | Admitting: Emergency Medicine

## 2020-12-22 ENCOUNTER — Emergency Department (HOSPITAL_COMMUNITY)
Admission: EM | Admit: 2020-12-22 | Discharge: 2020-12-23 | Disposition: A | Discharge status: Home / Self Care | Payer: Medicare Other | Attending: Emergency Medicine | Admitting: Emergency Medicine

## 2020-12-22 DIAGNOSIS — R7989 Other specified abnormal findings of blood chemistry: Secondary | ICD-10-CM

## 2020-12-22 DIAGNOSIS — R55 Syncope and collapse: Secondary | ICD-10-CM | POA: Diagnosis not present

## 2020-12-22 DIAGNOSIS — T7840XA Allergy, unspecified, initial encounter: Secondary | ICD-10-CM

## 2020-12-22 DIAGNOSIS — N186 End stage renal disease: Secondary | ICD-10-CM | POA: Diagnosis not present

## 2020-12-22 DIAGNOSIS — R944 Abnormal results of kidney function studies: Secondary | ICD-10-CM | POA: Diagnosis not present

## 2020-12-22 DIAGNOSIS — Z794 Long term (current) use of insulin: Secondary | ICD-10-CM | POA: Insufficient documentation

## 2020-12-22 DIAGNOSIS — Z7982 Long term (current) use of aspirin: Secondary | ICD-10-CM | POA: Diagnosis not present

## 2020-12-22 DIAGNOSIS — E8771 Transfusion associated circulatory overload: Secondary | ICD-10-CM | POA: Diagnosis not present

## 2020-12-22 DIAGNOSIS — I120 Hypertensive chronic kidney disease with stage 5 chronic kidney disease or end stage renal disease: Secondary | ICD-10-CM | POA: Diagnosis not present

## 2020-12-22 DIAGNOSIS — R21 Rash and other nonspecific skin eruption: Secondary | ICD-10-CM | POA: Diagnosis present

## 2020-12-22 DIAGNOSIS — E119 Type 2 diabetes mellitus without complications: Secondary | ICD-10-CM | POA: Diagnosis not present

## 2020-12-22 DIAGNOSIS — Z992 Dependence on renal dialysis: Secondary | ICD-10-CM | POA: Insufficient documentation

## 2020-12-22 DIAGNOSIS — Z79899 Other long term (current) drug therapy: Secondary | ICD-10-CM | POA: Insufficient documentation

## 2020-12-22 LAB — BASIC METABOLIC PANEL
Anion gap: 17 — ABNORMAL HIGH (ref 5–15)
BUN: 72 mg/dL — ABNORMAL HIGH (ref 6–20)
CO2: 20 mmol/L — ABNORMAL LOW (ref 22–32)
Calcium: 9.2 mg/dL (ref 8.9–10.3)
Chloride: 102 mmol/L (ref 98–111)
Creatinine, Ser: 10.75 mg/dL — ABNORMAL HIGH (ref 0.61–1.24)
GFR, Estimated: 5 mL/min — ABNORMAL LOW (ref >60)
Glucose, Bld: 137 mg/dL — ABNORMAL HIGH (ref 70–99)
Potassium: 4.8 mmol/L (ref 3.5–5.1)
Sodium: 139 mmol/L (ref 135–145)

## 2020-12-22 LAB — TROPONIN I (HIGH SENSITIVITY)
Troponin I (High Sensitivity): 55 ng/L — ABNORMAL HIGH (ref <18)
Troponin I (High Sensitivity): 78 ng/L — ABNORMAL HIGH (ref <18)

## 2020-12-22 LAB — CBC
HCT: 36.2 % — ABNORMAL LOW (ref 39.0–52.0)
Hemoglobin: 11.4 g/dL — ABNORMAL LOW (ref 13.0–17.0)
MCH: 27 pg (ref 26.0–34.0)
MCHC: 31.5 g/dL (ref 30.0–36.0)
MCV: 85.8 fL (ref 80.0–100.0)
Platelets: 230 10*3/uL (ref 150–400)
RBC: 4.22 MIL/uL (ref 4.22–5.81)
RDW: 16.8 % — ABNORMAL HIGH (ref 11.5–15.5)
WBC: 16.5 10*3/uL — ABNORMAL HIGH (ref 4.0–10.5)
nRBC: 0 % (ref 0.0–0.2)

## 2020-12-22 NOTE — ED Triage Notes (Addendum)
Pt transported from [place] after ?allergic reaction during fistulagram. Fistula in LUA accessed.  Staff reports 30 min after receiving contrast pt had some shob now scratching both arms. Lungs clear, A & O. Pt received Versed 2, Fent 109mg, Heparin 5000 and contrast @ 1100 am, pt reports taking Benadryl this am before procedure.  Pt reports feeling shob, dizzy, no visible hives to arms, pt states he has had a reaction to contrast in the past.  Pt requesting food.

## 2020-12-23 ENCOUNTER — Emergency Department (HOSPITAL_COMMUNITY): Payer: Medicare Other

## 2020-12-23 DIAGNOSIS — T7840XA Allergy, unspecified, initial encounter: Secondary | ICD-10-CM | POA: Diagnosis not present

## 2020-12-23 LAB — CBG MONITORING, ED: Glucose-Capillary: 116 mg/dL — ABNORMAL HIGH (ref 70–99)

## 2020-12-23 LAB — TROPONIN I (HIGH SENSITIVITY): Troponin I (High Sensitivity): 79 ng/L — ABNORMAL HIGH (ref <18)

## 2020-12-23 NOTE — ED Notes (Addendum)
Pt states that rash and itching has subsided. Pt denies chest pain at this time.

## 2020-12-23 NOTE — ED Notes (Signed)
Pt given a [place] sandwich and a cup of applesauce.

## 2020-12-23 NOTE — ED Provider Notes (Signed)
[place] EMERGENCY DEPARTMENT Provider Note   CSN: [id number] Arrival date & time: 12/22/20  1204     History Chief Complaint  Patient presents with  . Allergic Reaction    Donald Alvarez is a 55 y.o. male with a history of ESRD on HD (T/R/S at [facility]) 2/2 polycystic kidney disease, hyperparathyroidism, HTN, gout, diabetes mellitus who presents the emergency department by EMS from [facility] with a chief complaint of rash.  Staff reported to EMS that approximately 30 minutes after receiving contrast for a fistulogram at 11 AM that the patient developed shortness of breath pruritus.  He was actively scratching both arms.  In route, the patient received Versed, 50 mcg of fentanyl, and heparin.   Patiented to EMS that he previously had a reaction to contrast in the past and had taken Benadryl earlier in the day prior to the procedure.  He was initially feeling pruritic, shortness of breath, dizzy.  On my evaluation, pruritus and dizziness has resolved.  He is endorsing very mild shortness of breath, but reports that he is scheduled for dialysis today.  He reports that after the procedure that he developed itching to his bilateral arms and believes that he had a syncopal episode. He adamantly denies chest pain, leg swelling, abdominal distention, palpitations, back pain, lip or tongue swelling, throat closing, wheezing, dysphagia, numbness, or weakness.  He reports that he is back to his baseline and feels how he typically does before he requires dialysis.  He reports that he is scheduled for dialysis in [place] at 6 AM.  Per chart review, per the patient's neurologist, the patient must have his blood pressure monitored closely during any procedures that require anesthesia.  The history is provided by the patient and medical records. No language interpreter was used.       Past Medical History:  Diagnosis Date  . Anemia   . Complication of  anesthesia    Pt stated that his blood preesure must be monitored closely during any surgical procedure ( per neurologist)  . Diabetes mellitus without complication ([place])   . ESRD on dialysis ([place])   . Gout   . HLD (hyperlipidemia)   . Hyperparathyroidism due to renal insufficiency ([place])   . Hypertension   . Renal disorder     Patient Active Problem List   Diagnosis Date Noted  . Chronic cough 12/11/2018  . Gout 07/09/2016  . HLD (hyperlipidemia) 07/09/2016  . ESRD on dialysis ([place])   . Bilateral edema of lower extremity 05/20/2014  . HTN (hypertension) 05/20/2014  . Diabetes mellitus without complication ([place]) Q000111Q  . Anemia 05/20/2014  . Polycystic kidney disease, autosomal dominant 05/20/2014  . Gout flare 05/20/2014  . Neuropathy in diabetes ([place]) 11/23/2013  . Plantar fasciitis of left foot 11/23/2013  . Callus of foot 11/23/2013  . Porokeratosis 11/23/2013    Past Surgical History:  Procedure Laterality Date  . AV FISTULA PLACEMENT Left 07/17/2015   Procedure: Left Upper Arm  ARTERIOVENOUS FISTULA CREATION;  Surgeon: [name], MD;  Location: [facility] OR;  Service: Vascular;  Laterality: Left;  . NO PAST SURGERIES         Family History  Problem Relation Age of Onset  . Diabetes Mellitus II Mother   . Kidney disease Mother   . Diabetes Mother   . Hypertension Mother   . CAD Father   . Heart disease Father   . Hypertension Father     Social History  Tobacco Use  . Smoking status: Never Smoker  . Smokeless tobacco: Never Used  Substance Use Topics  . Alcohol use: No    Alcohol/week: 0.0 standard drinks  . Drug use: No    Home Medications Prior to Admission medications   Medication Sig Start Date End Date Taking? Authorizing Provider  allopurinol (ZYLOPRIM) 100 MG tablet Take 100 mg by mouth daily. 01/18/20  Yes [provider]  amLODipine (NORVASC) 10 MG tablet Take 10 mg by mouth daily.   Yes [provider]  aspirin 81 MG  tablet Take 81 mg by mouth daily.   Yes [provider]  B Complex-C-Folic Acid (DIALYVITE Q000111Q) 0.8 MG TABS Take 1 tablet by mouth daily. 11/16/18  Yes [provider]  carvedilol (COREG) 25 MG tablet Take 25 mg by mouth 2 (two) times daily with a meal.   Yes [provider]  cinacalcet (SENSIPAR) 60 MG tablet Take 60 mg by mouth daily. 12/21/20  Yes [provider]  ferric citrate (AURYXIA) 1 GM 210 MG(Fe) tablet Take 210 mg by mouth with breakfast, with lunch, and with evening meal. 06/20/20  Yes [provider]  hydrALAZINE (APRESOLINE) 100 MG tablet Take 100 mg by mouth 3 (three) times daily. 07/24/18  Yes [provider]  lanthanum (FOSRENOL) 1000 MG chewable tablet Chew 1,000 mg by mouth in the morning, at noon, and at bedtime. 04/06/20  Yes [provider]  lidocaine-prilocaine (EMLA) cream Apply 1 application topically See admin instructions. With dialysis Tuesday,thursday,saturday 11/23/20  Yes [provider]  LOKELMA 10 g PACK packet Take 1 packet by mouth daily. 12/05/20  Yes [provider]  NOVOLOG MIX 70/30 (70-30) 100 UNIT/ML injection Inject 10 Units into the skin in the morning and at bedtime. 09/20/20  Yes [provider]  Respiratory Therapy Supplies (FLUTTER) DEVI 1 Device by Does not apply route as needed. 09/16/19  Yes Lauraine Rinne, NP  traZODone (DESYREL) 100 MG tablet Take 100 mg by mouth at bedtime as needed for sleep. 10/29/20  Yes [provider]    Allergies    Iodinated diagnostic agents, Lisinopril, Other, and Ultrasound gel  Review of Systems   Review of Systems  Constitutional: Negative for appetite change, chills and fever.  HENT: Negative for congestion and sore throat.   Eyes: Negative for visual disturbance.  Respiratory: Positive for shortness of breath. Negative for cough, choking, chest tightness and wheezing.   Cardiovascular: Negative for chest pain, palpitations  and leg swelling.  Gastrointestinal: Negative for abdominal pain, anal bleeding, constipation, diarrhea, nausea and vomiting.  Genitourinary: Negative for dysuria and urgency.  Musculoskeletal: Negative for arthralgias, back pain, joint swelling, myalgias, neck pain and neck stiffness.  Skin: Positive for rash. Negative for color change and wound.  Allergic/Immunologic: Negative for immunocompromised state.  Neurological: Positive for syncope. Negative for dizziness, tremors, seizures, weakness, light-headedness, numbness and headaches.  Psychiatric/Behavioral: Negative for confusion.    Physical Exam Updated Vital Signs BP (!) 169/92 (BP Location: Right Arm)   Pulse 87   Temp (!) 97.3 F (36.3 C) (Oral)   Resp 16   Ht '6\' 2"'$  (1.88 m)   Wt 78.9 kg   SpO2 94%   BMI 22.33 kg/m   Physical Exam Vitals and nursing note reviewed.  Constitutional:      General: He is not in acute distress.    Appearance: He is well-developed. He is not ill-appearing, toxic-appearing or diaphoretic.     Comments: Well-appearing.  No acute distress.  HENT:     Head: Normocephalic.     Nose: Nose normal. No congestion or rhinorrhea.     Mouth/Throat:     Mouth: Mucous membranes are moist.     Pharynx: No oropharyngeal exudate or posterior oropharyngeal erythema.  Eyes:     General: No scleral icterus.    Conjunctiva/sclera: Conjunctivae normal.  Cardiovascular:     Rate and Rhythm: Normal rate and regular rhythm.     Pulses: Normal pulses.     Heart sounds: Normal heart sounds. No murmur heard. No friction rub. No gallop.      Comments: Heart rate is normal. Pulmonary:     Effort: Pulmonary effort is normal. No respiratory distress.     Breath sounds: Normal breath sounds. No stridor. No wheezing, rhonchi or rales.     Comments: Faint crackles in the bilateral bases.  No wheezes. Chest:     Chest wall: No tenderness.  Abdominal:     General: There is no distension.     Palpations: Abdomen is  soft. There is no mass.     Tenderness: There is no abdominal tenderness. There is no right CVA tenderness, left CVA tenderness, guarding or rebound.     Hernia: No hernia is present.     Comments: Abdomen is soft, nontender  Musculoskeletal:        General: No tenderness.     Cervical back: Normal range of motion and neck supple.     Right lower leg: No edema.     Left lower leg: No edema.  Skin:    General: Skin is warm and dry.     Findings: No rash.     Comments: No hives  Neurological:     Mental Status: He is alert.     Comments: No focal neurologic deficits.  Psychiatric:        Behavior: Behavior normal.     ED Results / Procedures / Treatments   Labs (all labs ordered are listed, but only abnormal results are displayed) Labs Reviewed  CBC - Abnormal; Notable for the following components:      Result Value   WBC 16.5 (*)    Hemoglobin 11.4 (*)    HCT 36.2 (*)    RDW 16.8 (*)    All other components within normal limits  BASIC METABOLIC PANEL - Abnormal; Notable for the following components:   CO2 20 (*)    Glucose, Bld 137 (*)    BUN 72 (*)    Creatinine, Ser 10.75 (*)    GFR, Estimated 5 (*)    Anion gap 17 (*)    All other components within normal limits  CBG MONITORING, ED - Abnormal; Notable for the following components:   Glucose-Capillary 116 (*)    All other components within normal limits  TROPONIN I (HIGH SENSITIVITY) - Abnormal; Notable for the following components:   Troponin I (High Sensitivity) 55 (*)    All other components within normal limits  TROPONIN I (HIGH SENSITIVITY) - Abnormal; Notable for the following components:   Troponin I (High Sensitivity) 78 (*)    All other components within normal limits  TROPONIN I (HIGH SENSITIVITY) - Abnormal; Notable for the following components:   Troponin I (High Sensitivity) 79 (*)    All other components within normal limits  TROPONIN I (HIGH SENSITIVITY)    EKG EKG  Interpretation  Date/Time:  Friday December 22 2020 12:14:43 EST Ventricular Rate:  78 PR Interval:  150 QRS Duration: 90 QT Interval:  442 QTC Calculation: 503 R Axis:   26 Text Interpretation: Normal sinus rhythm Left ventricular hypertrophy with repolarization abnormality ( Sokolow-Lyon ) Prolonged QT Confirmed by Dory Horn) on 12/23/2020 4:11:30 AM   Radiology DG Chest Portable 1 View  Result Date: 12/23/2020 CLINICAL DATA:  Shortness of breath. Patient reports itching in the upper extremities bilaterally after receiving contrast at 11 a.m. yesterday for a fistulogram. EXAM: PORTABLE CHEST 1 VIEW COMPARISON:  Two-view chest x-ray 11/29/2020 FINDINGS: Heart size upper limits of normal. Atherosclerotic changes are noted in the aortic arch. Mild interstitial prominence is present compared to the prior exam. No focal airspace disease is present. No significant pleural effusion or pneumothorax is present. Left axillary stent noted. IMPRESSION: 1. Mild interstitial prominence suggesting mild edema. 2. No focal airspace disease. Electronically Signed   By: San Morelle M.D.   On: 12/23/2020 05:44    Procedures Procedures   Medications Ordered in ED Medications - No data to display  ED Course  I have reviewed the triage vital signs and the nursing notes.  Pertinent labs & imaging results that were available during my care of the patient were reviewed by me and considered in my medical decision making (see chart for details).    MDM Rules/Calculators/A&P                          55 year old male with a history of ESRD on HD (T/R/S at [facility]) 2/2 polycystic kidney disease, hyperparathyroidism, HTN, gout, diabetes mellitus who presents the emergency department by EMS from a [facility] after he developed pruritus following a fistulogram earlier today.  Approximately 30 minutes after the procedure, he developed itching and reports a previous allergic  reaction to contrast.  The patient also states that he had a syncopal episode prior to arrival and was endorsing dizziness and shortness of breath.  However, all of these symptoms aside from mild shortness of breath have resolved.  Notably, the patient is due for dialysis this morning.  The patient was seen and independently evaluated by Dr. Randal Buba, attending physician.  Patient is hypertensive, likely secondary to needing dialysis.  No hypoxia, tachypnea, patient is afebrile.  Differential diagnosis includes allergic reaction, anaphylaxis, angioedema, vasovagal syncope, PE, postprocedure infection, CVA.  Labs and imaging have been reviewed and independently interpreted by me.  Creatinine is elevated at 10.75.  Bicarb is 20 and patient's anion gap is elevated at 17, but this appears to be the patient's baseline based on lab trends.  Potassium is normal.  WBC is 16.5.  Patient is endorsing no infectious symptoms.  Likely reactive.  Chest x-ray with mild interstitial edema.  EKG with normal sinus rhythm and prolonged QTC at 503.  No evidence of S1Q3T3 or ischemia.   Initial troponin is 55.  No history of previous high-sensitivity troponin.  Repeat troponin is 78.  I do suspect that the patient had an allergic reaction to the contrast given the pruritus earlier today.  In regard to the syncopal episode, I question if he had a vasovagal syncopal episode following the procedure.  I am much less suspicious that this was anaphylaxis or angioedema as the patient did not receive epinephrine and symptoms resolved with Benadryl.  I did strongly consider a PE.  However, the patient has had no hypoxia or tachypnea.  He has adamantly denied any chest pain since he has been in the emergency department.  No wedge sign noted on chest x-ray.  No S1Q3T3.  So I am less suspicious for this at this time.  The patient does have a history of dilated ascending aorta, but presentation is not consistent with aortic  dissection.  Regarding his his uptrending troponin.  Patient does continue to actively denied chest pain.  We will plan to order a third troponin for further evaluation.  Repeat troponin is 79, flat trend.  Doubt ACS.  I spoke with Dr. Marval Regal, nephrology, since the patient is scheduled for dialysis at 6 AM.  He was able to get the patient a chair at 11 AM.  Patient is agreeable and will be able to make this appointment.  ER return precautions given.  He has a follow-up appointment this week with his transplant team, which I have encouraged him to keep.  Safer discharge to home with outpatient follow-up as indicated.  Final Clinical Impression(s) / ED Diagnoses Final diagnoses:  Allergic reaction, initial encounter  Syncope, unspecified syncope type  Transfusion associated circulatory overload  Elevated serum creatinine    Rx / DC Orders ED Discharge Orders    None       Joanne Gavel, PA-C 12/23/20 0724    Palumbo, April, MD 12/23/20 2329

## 2020-12-23 NOTE — Discharge Instructions (Addendum)
Thank you for allowing me to care for you today in the Emergency Department.   Your work-up today was reassuring.  You can take Benadryl as prescribed on the label if you have any residual itching.  Please go to dialysis this morning.  They are expecting you by 11 AM.  Please keep your follow-up appointment with the transplant team this week.  Return to the emergency department if you have another syncopal episode, develop chest pain, respiratory distress, throat closing, tongue swelling, or other new, concerning symptoms.

## 2021-01-24 ENCOUNTER — Encounter: Payer: Self-pay | Admitting: Pulmonary Disease

## 2021-01-24 ENCOUNTER — Other Ambulatory Visit: Payer: Self-pay

## 2021-01-24 ENCOUNTER — Ambulatory Visit (INDEPENDENT_AMBULATORY_CARE_PROVIDER_SITE_OTHER): Payer: Medicare Other | Admitting: Pulmonary Disease

## 2021-01-24 VITALS — BP 128/60 | HR 60 | Temp 97.5°F | Ht 74.0 in | Wt 162.4 lb

## 2021-01-24 DIAGNOSIS — J329 Chronic sinusitis, unspecified: Secondary | ICD-10-CM | POA: Diagnosis not present

## 2021-01-24 DIAGNOSIS — R9389 Abnormal findings on diagnostic imaging of other specified body structures: Secondary | ICD-10-CM | POA: Insufficient documentation

## 2021-01-24 DIAGNOSIS — R053 Chronic cough: Secondary | ICD-10-CM

## 2021-01-24 NOTE — Progress Notes (Signed)
   Subjective:    Patient ID: Donald Alvarez, male    DOB: [birth year], 55 y.o.   MRN: [id number]  HPI 55 yo never smoker with ESRD on dialysis  for FU of chronic cough and wheezing. He is of Asian origin from [place] originally , lived in [place] > 20 years PMH - polycystic kidney disease , ESRD on HD He is also listed for transplant. Cough started 05/2018 initially and was associated with shortness of breath and left upper lobe infiltrate noted on chest x-ray and CT scan.  He was treated as healthcare associated pneumonia at [facility].  CT chest from 06/09/2018 shows masslike consolidation in the left upper lobe. He then had symptoms in 07/2018 and CT chest then showed resolution of this masslike consolidation but bilateral multifocal patchy infiltrates, he was treated for multifocal pneumonia. He was then also hospitalized at [facility] on 07/26/2018 with chest x-ray showing bilateral consolidation versus edema, but resolution in 2 days after dialysis suggesting that this was all related to pulmonary edema/fluid overload   Chief Complaint  Patient presents with  . Follow-up    Pt stated that the cough is not better.   He stated that the transplant doctor did a ct scan. He has new information for RA   Last seen 08/2019 His cough has now been persistent, although intermittently for 2 years.  He states that he feels a drip and a tickle in his throat, cough is productive of clear white sputum. We reviewed his previous CT scans which did not show bronchiectasis.  He had another CT scan done as part of [facility].  He denies fevers her weight loss.  He lives in [place] and does his dialysis and [place]. He denies frequent episodes of bronchitis  Significant tests/ events reviewed  04/21/2019-pharmacological stress echo-, LV ejection fraction 35 to 40% with stress, positive dobutamine echocardiogram for inducible ischemia  HRCT 11/2018 Very mild  scattered post infectious/postinflammatory scarring , Enlarged pulmonary arteries  CT chest 12/2020 Few tiny bilateral lower lobe clustered pulmonary nodules in a tree-in-bud configuration suggestive of infectious/inflammatory sequela.   Echo 11/2020 EF improved to 40 to 45%   Review of Systems neg for any significant sore throat, dysphagia, itching, sneezing, nasal congestion or excess/ purulent secretions, fever, chills, sweats, unintended wt loss, pleuritic or exertional cp, hempoptysis, orthopnea pnd or change in chronic leg swelling. Also denies presyncope, palpitations, heartburn, abdominal pain, nausea, vomiting, diarrhea or change in bowel or urinary habits, dysuria,hematuria, rash, arthralgias, visual complaints, headache, numbness weakness or ataxia.     Objective:   Physical Exam  Gen. Pleasant, thin man, in no distress ENT - no thrush, no pallor/icterus,no post nasal drip Neck: No JVD, no thyromegaly, no carotid bruits Lungs: no use of accessory muscles, no dullness to percussion, clear without rales or rhonchi  Cardiovascular: Rhythm regular, heart sounds  normal, no murmurs or gallops, no peripheral edema Musculoskeletal: No deformities, no cyanosis or clubbing         Assessment & Plan:

## 2021-01-24 NOTE — Patient Instructions (Signed)
  CT sinuses -limited , if infection will use antibiotics If negative, will treat for reflux with protonix x 4 weeks

## 2021-01-24 NOTE — Assessment & Plan Note (Signed)
He seems to have a postnasal drip causing intermittent cough.  This is now persisted for 2 years, we will proceed with a CT scan of his sinuses if he has significant sinusitis then we will treat with prolonged course of antibiotics. If negative then will consider empiric trial for GERD with PPI for 4 weeks He does not seem to have significant fluid overload

## 2021-01-24 NOTE — Assessment & Plan Note (Signed)
Tree-in-bud configuration noted in right lower lobe on CT scan in February 2022 is new compared to 2020 and is likely postinflammatory

## 2021-01-25 ENCOUNTER — Ambulatory Visit (HOSPITAL_BASED_OUTPATIENT_CLINIC_OR_DEPARTMENT_OTHER)
Admission: RE | Admit: 2021-01-25 | Discharge: 2021-01-25 | Disposition: A | Discharge status: Home / Self Care | Payer: Medicare Other | Source: Ambulatory Visit | Attending: Pulmonary Disease | Admitting: Pulmonary Disease

## 2021-01-25 DIAGNOSIS — R197 Diarrhea, unspecified: Secondary | ICD-10-CM | POA: Insufficient documentation

## 2021-01-25 DIAGNOSIS — J329 Chronic sinusitis, unspecified: Secondary | ICD-10-CM | POA: Diagnosis not present

## 2021-01-26 ENCOUNTER — Telehealth: Payer: Self-pay | Admitting: Pulmonary Disease

## 2021-01-26 DIAGNOSIS — J329 Chronic sinusitis, unspecified: Secondary | ICD-10-CM

## 2021-01-26 MED ORDER — AMOXICILLIN-POT CLAVULANATE 875-125 MG PO TABS: 1.0000 | ORAL_TABLET | Freq: Two times a day (BID) | ORAL | 0 refills | Status: DC

## 2021-01-26 NOTE — Telephone Encounter (Signed)
-----   Message from Rigoberto Noel, MD sent at 01/26/2021  8:45 AM EST ----- Opacified right frontal sinus  - refer to ENT We can start 14 day course of augmentin 875 bid - take probiotic or yogurt with this

## 2021-01-26 NOTE — Telephone Encounter (Addendum)
Rigoberto Noel, MD  01/26/2021 8:45 AM EST      Opacified right frontal sinus - refer to ENT We can start 14 day course of augmentin 875 bid - take probiotic or yogurt with this   Called and and there was no answer-LMTCB and went ahead and sent rx so it can be ready soon

## 2021-01-26 NOTE — Progress Notes (Signed)
Called and went over CT results per Dr Elsworth Soho with patient. Patient expressed full understanding and agreeable to ENT referral order being placed. Order placed per Dr Elsworth Soho. Patient expressed full understanding of Augmentin being ordered and sent to pharmacy and understanding instruction on taking medication. See today's (01/26/2021) telephone note. Nothing further needed at this time.

## 2021-01-26 NOTE — Telephone Encounter (Signed)
Called and went over CT results per Dr Elsworth Soho with patient. All questions answered and patient expressed full understanding. Patient agreeable to Dr Bari Mantis recommendation for referral to ENT (ordered per Dr Elsworth Soho) and expressed understanding of augmentin being ordered and to take Twice a day- take probiotic or yogurt with this. Patient stated nothing further needed at this time.

## 2021-01-26 NOTE — Telephone Encounter (Signed)
pt is calling in regards to CT scan of his sinuses from 01/24/21, was told they would be back the next day.. pt states that its really bothering him so he would like to know if Dr. Elsworth Soho willl be sending something to Pharm. uses [facility] on [address] in [place] [phone]

## 2021-02-01 ENCOUNTER — Telehealth: Payer: Self-pay | Admitting: Pulmonary Disease

## 2021-02-01 NOTE — Telephone Encounter (Signed)
[facility]  [facility] team do we have any update regarding this referral. Thanks :)

## 2021-02-06 DIAGNOSIS — Z111 Encounter for screening for respiratory tuberculosis: Secondary | ICD-10-CM | POA: Insufficient documentation

## 2021-02-09 NOTE — Telephone Encounter (Signed)
Pt is scheduled for 02/28/21.

## 2021-02-28 ENCOUNTER — Ambulatory Visit (INDEPENDENT_AMBULATORY_CARE_PROVIDER_SITE_OTHER): Payer: Medicare Other | Admitting: Otolaryngology

## 2021-02-28 ENCOUNTER — Encounter (INDEPENDENT_AMBULATORY_CARE_PROVIDER_SITE_OTHER): Payer: Self-pay | Admitting: Otolaryngology

## 2021-02-28 ENCOUNTER — Other Ambulatory Visit: Payer: Self-pay

## 2021-02-28 VITALS — Temp 97.2°F

## 2021-02-28 DIAGNOSIS — J310 Chronic rhinitis: Secondary | ICD-10-CM

## 2021-02-28 DIAGNOSIS — K219 Gastro-esophageal reflux disease without esophagitis: Secondary | ICD-10-CM | POA: Diagnosis not present

## 2021-02-28 NOTE — Progress Notes (Signed)
HPI: Donald Alvarez is a 55 y.o. male who presents is referred by Dr. Radene Gunning for evaluation of chronic cough that he has had for 2-1/2 to 3 years.  He also complains of postnasal drainage.  He also complains of chronic throat clearing like he has something stuck down in his throat and points to the area of the cricoid cartilage.  He has had x-rays performed of his chest as well as his head and on a recent CT scan of his sinuses he has some obstruction of the right anterior ethmoid and right frontal region. He has not used any nasal steroid sprays.  He denies any yellow-green discharge from his nose.  He has been treated with several rounds of antibiotics in the past without much improvement..  Past Medical History:  Diagnosis Date  . Anemia   . Complication of anesthesia    Pt stated that his blood preesure must be monitored closely during any surgical procedure ( per neurologist)  . Diabetes mellitus without complication ([place])   . ESRD on dialysis ([place])   . Gout   . HLD (hyperlipidemia)   . Hyperparathyroidism due to renal insufficiency ([place])   . Hypertension   . Renal disorder    Past Surgical History:  Procedure Laterality Date  . AV FISTULA PLACEMENT Left 07/17/2015   Procedure: Left Upper Arm  ARTERIOVENOUS FISTULA CREATION;  Surgeon: [name], MD;  Location: [facility] OR;  Service: Vascular;  Laterality: Left;  . NO PAST SURGERIES     Social History   Socioeconomic History  . Marital status: Married    Spouse name: Not on file  . Number of children: Not on file  . Years of education: Not on file  . Highest education level: Not on file  Occupational History  . Not on file  Tobacco Use  . Smoking status: Never Smoker  . Smokeless tobacco: Never Used  Substance and Sexual Activity  . Alcohol use: No    Alcohol/week: 0.0 standard drinks  . Drug use: No  . Sexual activity: Not on file  Other Topics Concern  . Not on file  Social History Narrative  . Not on file   Social  Determinants of Health   Financial Resource Strain: Not on file  Food Insecurity: Not on file  Transportation Needs: Not on file  Physical Activity: Not on file  Stress: Not on file  Social Connections: Not on file   Family History  Problem Relation Age of Onset  . Diabetes Mellitus II Mother   . Kidney disease Mother   . Diabetes Mother   . Hypertension Mother   . CAD Father   . Heart disease Father   . Hypertension Father    Allergies  Allergen Reactions  . Iodinated Diagnostic Agents Hives  . Lisinopril Hives  . Other Other (See Comments)    Itching and rash from "ultrasound gel"  . Ultrasound Gel Itching and Rash   Prior to Admission medications   Medication Sig Start Date End Date Taking? Authorizing Provider  allopurinol (ZYLOPRIM) 100 MG tablet Take 100 mg by mouth daily. 01/18/20   [provider]  amLODipine (NORVASC) 10 MG tablet Take 10 mg by mouth daily.    [provider]  amoxicillin-clavulanate (AUGMENTIN) 875-125 MG tablet Take 1 tablet by mouth 2 (two) times daily. 01/26/21   Rigoberto Noel, MD  aspirin 81 MG tablet Take 81 mg by mouth daily.    [provider]  B Complex-C-Folic Acid (  DIALYVITE 800) 0.8 MG TABS Take 1 tablet by mouth daily. 11/16/18   [provider]  carvedilol (COREG) 25 MG tablet Take 25 mg by mouth 2 (two) times daily with a meal.    [provider]  cinacalcet (SENSIPAR) 60 MG tablet Take 60 mg by mouth daily. 12/21/20   [provider]  ferric citrate (AURYXIA) 1 GM 210 MG(Fe) tablet Take 210 mg by mouth with breakfast, with lunch, and with evening meal. 06/20/20   [provider]  hydrALAZINE (APRESOLINE) 100 MG tablet Take 100 mg by mouth 3 (three) times daily. 07/24/18   [provider]  lanthanum (FOSRENOL) 1000 MG chewable tablet Chew 1,000 mg by mouth in the morning, at noon, and at bedtime. 04/06/20   [provider]  lidocaine-prilocaine (EMLA) cream Apply 1  application topically See admin instructions. With dialysis Tuesday,thursday,saturday 11/23/20   [provider]  LOKELMA 10 g PACK packet Take 1 packet by mouth daily. 12/05/20   [provider]  NOVOLOG MIX 70/30 (70-30) 100 UNIT/ML injection Inject 10 Units into the skin in the morning and at bedtime. 09/20/20   [provider]  Respiratory Therapy Supplies (FLUTTER) DEVI 1 Device by Does not apply route as needed. 09/16/19   Lauraine Rinne, NP  traZODone (DESYREL) 100 MG tablet Take 100 mg by mouth at bedtime as needed for sleep. 10/29/20   [provider]     Positive ROS: Otherwise negative  All other systems have been reviewed and were otherwise negative with the exception of those mentioned in the HPI and as above.  Physical Exam: Constitutional: Alert, well-appearing, no acute distress Ears: External ears without lesions or tenderness. Ear canals are clear bilaterally with intact, clear TMs.  Nasal: External nose without lesions. Septum relatively midline with mild rhinitis..  Nasal passages were clear bilaterally with no mucopurulent discharge noted.  No polyps.  Minimal mucus within the nose is clear.  On nasal endoscopy on the right side the middle meatus region was clear with no mucopurulent discharge noted and no polypoid disease.  The nasopharynx was clear with minimal clear mucus discharge. Oral: Lips and gums without lesions. Tongue and palate mucosa without lesions. Posterior oropharynx clear. Fiberoptic laryngoscopy was performed to the right nostril.  The nasopharynx was clear.  The base of tongue vallecula epiglottis were normal.  Vocal cords were clear bilaterally.  No significant supraglottic mucus noted.  However he did have moderate edema of the arytenoid mucosa consistent with probable reflux symptoms.  This was pale and boggy. Neck: No palpable adenopathy or masses Respiratory: Breathing comfortably  Skin: No facial/neck lesions or rash  noted.  Laryngoscopy  Date/Time: 02/28/2021 9:43 AM Performed by: Rozetta Nunnery, MD Authorized by: Rozetta Nunnery, MD   Consent:    Consent obtained:  Verbal   Consent given by:  Patient Procedure details:    Indications: direct visualization of the upper aerodigestive tract     Medication:  Afrin   Instrument: flexible fiberoptic laryngoscope     Scope location: right nare   Sinus:    Right middle meatus: normal     Right nasopharynx: normal   Mouth:    Vallecula: normal     Base of tongue: normal     Epiglottis: normal   Throat:    Pyriform sinus: normal     True vocal cords: normal   Comments:     On fiberoptic laryngoscopy the hypopharynx and larynx was clear.  He  had moderate arytenoid edema consistent with probable reflux type symptoms.  Mucus discharge from the posterior nasal cavity was clear and minimal.  I reviewed the CT scan with the patient in the office today.  This showed minimal opacification of the anterior ethmoid sinus region that extended up into the right nasofrontal duct.  The right frontal sinus was very small.  The posterior ethmoid on the right side was clear the remaining sinuses were all clear.  Assessment: Chronic rhinitis with minimal sinus obstruction that does not appear infective requiring further antibiotic therapy. 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  Plan: Prescribed Nasacort 2 sprays each nostril at night on a regular basis as this should help with the congestion within the right ethmoid sinus as well help diminish some postnasal drainage. I recommend continuation with saline nasal irrigation such as a Nettie pot that he is used in the past. I also prescribed omeprazole 40 mg daily before dinner as a suspect he sustains silent or nighttime reflux. On clinical exam today there is no evidence of active infection requiring further antibiotic therapy. He will  follow-up in 2 months for recheck.   Radene Journey, MD   CC:

## 2021-03-13 DIAGNOSIS — E7211 Homocystinuria: Secondary | ICD-10-CM | POA: Insufficient documentation

## 2021-03-13 DIAGNOSIS — Z992 Dependence on renal dialysis: Secondary | ICD-10-CM | POA: Insufficient documentation

## 2021-03-13 DIAGNOSIS — Z7682 Awaiting organ transplant status: Secondary | ICD-10-CM | POA: Insufficient documentation

## 2021-03-14 DIAGNOSIS — Z91041 Radiographic dye allergy status: Secondary | ICD-10-CM | POA: Insufficient documentation

## 2021-03-14 DIAGNOSIS — R0789 Other chest pain: Secondary | ICD-10-CM | POA: Insufficient documentation

## 2021-03-14 DIAGNOSIS — I25119 Atherosclerotic heart disease of native coronary artery with unspecified angina pectoris: Secondary | ICD-10-CM | POA: Insufficient documentation

## 2021-04-17 ENCOUNTER — Other Ambulatory Visit (INDEPENDENT_AMBULATORY_CARE_PROVIDER_SITE_OTHER): Payer: Self-pay

## 2021-04-17 ENCOUNTER — Telehealth (INDEPENDENT_AMBULATORY_CARE_PROVIDER_SITE_OTHER): Payer: Self-pay | Admitting: Otolaryngology

## 2021-04-17 MED ORDER — AMOXICILLIN-POT CLAVULANATE 875-125 MG PO TABS: 1.0000 | ORAL_TABLET | Freq: Two times a day (BID) | ORAL | 0 refills | Status: AC

## 2021-04-17 NOTE — Telephone Encounter (Signed)
Patient called in stating Dr. Lucia Gaskins would change his Rx for his sinus infection if the current Rx is not working. Patient is requesting a new Rx and would like a call back to discuss.   Donald Alvarez

## 2021-05-02 ENCOUNTER — Ambulatory Visit (INDEPENDENT_AMBULATORY_CARE_PROVIDER_SITE_OTHER): Payer: Medicare Other | Admitting: Otolaryngology

## 2021-05-18 ENCOUNTER — Encounter: Payer: Self-pay | Admitting: Medical-Surgical

## 2021-05-18 ENCOUNTER — Other Ambulatory Visit: Payer: Self-pay

## 2021-05-18 ENCOUNTER — Ambulatory Visit (INDEPENDENT_AMBULATORY_CARE_PROVIDER_SITE_OTHER): Payer: Medicare Other | Admitting: Medical-Surgical

## 2021-05-18 VITALS — BP 116/69 | HR 76 | Temp 98.7°F | Ht 73.0 in | Wt 167.6 lb

## 2021-05-18 DIAGNOSIS — Z7689 Persons encountering health services in other specified circumstances: Secondary | ICD-10-CM | POA: Diagnosis not present

## 2021-05-18 DIAGNOSIS — R21 Rash and other nonspecific skin eruption: Secondary | ICD-10-CM

## 2021-05-18 DIAGNOSIS — Z1211 Encounter for screening for malignant neoplasm of colon: Secondary | ICD-10-CM | POA: Diagnosis not present

## 2021-05-18 DIAGNOSIS — M109 Gout, unspecified: Secondary | ICD-10-CM | POA: Diagnosis not present

## 2021-05-18 MED ORDER — ALLOPURINOL 100 MG PO TABS: 100.0000 mg | ORAL_TABLET | Freq: Every day | ORAL | 1 refills | Status: DC

## 2021-05-18 MED ORDER — TRIAMCINOLONE ACETONIDE 0.1 % EX OINT: 1.0000 "application " | TOPICAL_OINTMENT | Freq: Two times a day (BID) | CUTANEOUS | 6 refills | Status: AC

## 2021-05-18 NOTE — Progress Notes (Signed)
vNew Patient Office Visit  Subjective:  Patient ID: Donald Alvarez, male    DOB: [birth year]  Age: 55 y.o. MRN: [id number]  CC:  Chief Complaint  Patient presents with   Establish Care    HPI Donald Alvarez presents to establish care. He is a pleasant 55 year old male who has his health needs mostly managed by his nephrologist. He is a hemodialysis patient and is currently on the waiting list for a kidney transplant. He was on the list a few years ago and almost done with the process but the [place] pandemic caused quite a bit of problems. He has now had to restart the process all over again. His progress with the requirements is going well but he has hit a snag regarding his colonoscopy. He reports a colonoscopy being done in [place] about 7 years ago. Unfortunately, the provider who did it is no longer in practice and he has been unable to find a way to locate the report. Now he is in a position where he either has to have another colonoscopy or find a different way to meet the requirements. Would like to avoid having to do the full colonoscopy as the first one was very unpleasant for him. Has done some research and would like to do a Cologuard and try to get a CT for colon cancer screening if possible. Has no GI symptoms or concerns.   Takes Allopurinol for treatment of gout. Tolerates the medication well, no side effects. Has not had a gout flare in quite a while. Needs a refill.     Past Medical History:  Diagnosis Date   Anemia    Complication of anesthesia    Pt stated that his blood preesure must be monitored closely during any surgical procedure ( per neurologist)   Diabetes mellitus without complication ([place])    ESRD on dialysis ([place])    Gout    HLD (hyperlipidemia)    Hyperparathyroidism due to renal insufficiency ([place])    Hypertension    Renal disorder     Past Surgical History:  Procedure Laterality Date   AV FISTULA PLACEMENT Left 07/17/2015   Procedure: Left Upper Arm   ARTERIOVENOUS FISTULA CREATION;  Surgeon: [name], MD;  Location: [facility] OR;  Service: Vascular;  Laterality: Left;   FISTULA PLUG     NO PAST SURGERIES      Family History  Problem Relation Age of Onset   Diabetes Mellitus II Mother    Kidney disease Mother    Diabetes Mother    Hypertension Mother    CAD Father    Heart disease Father    Hypertension Father     Social History   Socioeconomic History   Marital status: Married    Spouse name: Not on file   Number of children: Not on file   Years of education: Not on file   Highest education level: Not on file  Occupational History   Not on file  Tobacco Use   Smoking status: Never   Smokeless tobacco: Never  Vaping Use   Vaping Use: Never used  Substance and Sexual Activity   Alcohol use: Never   Drug use: Never   Sexual activity: Yes    Birth control/protection: None  Other Topics Concern   Not on file  Social History Narrative   Not on file   Social Determinants of Health   Financial Resource Strain: Not on file  Food Insecurity: Not on file  Transportation Needs: Not  on file  Physical Activity: Not on file  Stress: Not on file  Social Connections: Not on file  Intimate Partner Violence: Not on file    ROS Review of Systems  Constitutional:  Negative for chills, fatigue, fever and unexpected weight change.  Respiratory:  Negative for cough, chest tightness, shortness of breath and wheezing.   Cardiovascular:  Negative for chest pain, palpitations and leg swelling.  Gastrointestinal:  Negative for abdominal pain, constipation, diarrhea, nausea and vomiting.  Skin:  Positive for rash (bilateral posterior calves).  Neurological:  Negative for dizziness, light-headedness and headaches.  Psychiatric/Behavioral:  Negative for dysphoric mood, self-injury, sleep disturbance and suicidal ideas. The patient is not nervous/anxious.    Objective:   Today's Vitals: BP 116/69   Pulse 76   Temp 98.7 F (37.1 C)    Ht '6\' 1"'$  (1.854 m)   Wt 167 lb 9.6 oz (76 kg)   SpO2 94%   BMI 22.11 kg/m   Physical Exam Vitals and nursing note reviewed.  Constitutional:      General: He is not in acute distress.    Appearance: Normal appearance.  HENT:     Head: Normocephalic and atraumatic.  Cardiovascular:     Rate and Rhythm: Normal rate and regular rhythm.     Pulses: Normal pulses.     Heart sounds: Normal heart sounds. No murmur heard.   No friction rub. No gallop.     Arteriovenous access: Left arteriovenous access is present.    Comments: Left upper arm AVF positive for bruit and thrill. Pseudoaneurysms present at cannulation sites.  Pulmonary:     Effort: Pulmonary effort is normal. No respiratory distress.     Breath sounds: Normal breath sounds.  Skin:    General: Skin is warm and dry.  Neurological:     Mental Status: He is alert and oriented to person, place, and time.  Psychiatric:        Mood and Affect: Mood normal.        Behavior: Behavior normal.        Thought Content: Thought content normal.        Judgment: Judgment normal.    Assessment & Plan:   1. Encounter to establish care Reviewed available information and discussed health concerns with patient.   2. Colon cancer screening Ordering Cologuard. After investigation, his insurance will not cover a Virtual Colonoscopy CT. Patient message sent via MyChart with this information and a request to contact the transplant team to find out what they will accept in this situation.  - Cologuard  3. Gout, unspecified cause, unspecified chronicity, unspecified site Continue Allopurinol '100mg'$  daily.   4. Rash Unclear etiology. Suspect that this may be a type of contact dermatitis as it is located in an area that his legs come in contact with the dialysis chair foot rest. Possibly a reaction to the cleaners they use on the chairs between patients. Sending in triamcinolone ointment to use BID prn.   Outpatient Encounter Medications as  of 05/18/2021  Medication Sig   amLODipine (NORVASC) 10 MG tablet Take 10 mg by mouth daily.   aspirin 81 MG tablet Take 81 mg by mouth daily.   atorvastatin (LIPITOR) 40 MG tablet Take 1 tablet by mouth daily.   B Complex-C-Folic Acid (DIALYVITE Q000111Q) 0.8 MG TABS Take 1 tablet by mouth daily as needed.   carvedilol (COREG) 25 MG tablet Take 25 mg by mouth 2 (two) times daily with a meal.   cinacalcet (  SENSIPAR) 30 MG tablet Take 30 mg by mouth daily.   ferric citrate (AURYXIA) 1 GM 210 MG(Fe) tablet Take 210 mg by mouth with breakfast, with lunch, and with evening meal.   hydrALAZINE (APRESOLINE) 100 MG tablet Take 100 mg by mouth 2 (two) times daily.   lidocaine-prilocaine (EMLA) cream Apply 1 application topically See admin instructions. With dialysis Tuesday,thursday,saturday   LOKELMA 10 g PACK packet Take 1 packet by mouth daily.   NOVOLOG MIX 70/30 (70-30) 100 UNIT/ML injection Inject 10 Units into the skin 2 (two) times daily as needed.   Respiratory Therapy Supplies (FLUTTER) DEVI 1 Device by Does not apply route as needed.   triamcinolone ointment (KENALOG) 0.1 % Apply 1 application topically 2 (two) times daily. To affected areas   [DISCONTINUED] allopurinol (ZYLOPRIM) 100 MG tablet Take 100 mg by mouth daily.   allopurinol (ZYLOPRIM) 100 MG tablet Take 1 tablet (100 mg total) by mouth daily.   lanthanum (FOSRENOL) 1000 MG chewable tablet Chew 1,000 mg by mouth in the morning, at noon, and at bedtime.   traZODone (DESYREL) 100 MG tablet Take 100 mg by mouth at bedtime as needed for sleep.   No facility-administered encounter medications on file as of 05/18/2021.    Follow-up: Return in about 6 months (around 11/18/2021) for chronic disease follow up.   Clearnce Sorrel, DNP, APRN, FNP-BC [facility]

## 2021-06-01 ENCOUNTER — Telehealth: Payer: Self-pay

## 2021-06-01 NOTE — Telephone Encounter (Signed)
Patient called about having not heard anything about the CT colonoscopy.   Left msg for patient that Joy sent a mychart msg for him on July 1st explaining the situation. He will need to find out if a Cologuard with suffice or will he need a full colonoscopy. When he finds out which option will works best for his needs, he can let us know and we will be glad to order it.

## 2021-06-08 ENCOUNTER — Other Ambulatory Visit: Payer: Self-pay

## 2021-06-08 ENCOUNTER — Encounter: Payer: Self-pay | Admitting: Emergency Medicine

## 2021-06-08 ENCOUNTER — Ambulatory Visit (INDEPENDENT_AMBULATORY_CARE_PROVIDER_SITE_OTHER): Payer: Medicare Other | Admitting: Podiatry

## 2021-06-08 ENCOUNTER — Emergency Department (INDEPENDENT_AMBULATORY_CARE_PROVIDER_SITE_OTHER)
Admission: EM | Admit: 2021-06-08 | Discharge: 2021-06-08 | Disposition: A | Discharge status: Home / Self Care | Payer: Medicare Other | Source: Home / Self Care

## 2021-06-08 ENCOUNTER — Ambulatory Visit (INDEPENDENT_AMBULATORY_CARE_PROVIDER_SITE_OTHER): Payer: Medicare Other

## 2021-06-08 DIAGNOSIS — M7741 Metatarsalgia, right foot: Secondary | ICD-10-CM

## 2021-06-08 DIAGNOSIS — M2041 Other hammer toe(s) (acquired), right foot: Secondary | ICD-10-CM

## 2021-06-08 DIAGNOSIS — H6983 Other specified disorders of Eustachian tube, bilateral: Secondary | ICD-10-CM

## 2021-06-08 DIAGNOSIS — R7989 Other specified abnormal findings of blood chemistry: Secondary | ICD-10-CM | POA: Insufficient documentation

## 2021-06-08 DIAGNOSIS — M2042 Other hammer toe(s) (acquired), left foot: Secondary | ICD-10-CM

## 2021-06-08 DIAGNOSIS — M7742 Metatarsalgia, left foot: Secondary | ICD-10-CM

## 2021-06-08 DIAGNOSIS — L909 Atrophic disorder of skin, unspecified: Secondary | ICD-10-CM | POA: Diagnosis not present

## 2021-06-08 DIAGNOSIS — L84 Corns and callosities: Secondary | ICD-10-CM | POA: Diagnosis not present

## 2021-06-08 DIAGNOSIS — M722 Plantar fascial fibromatosis: Secondary | ICD-10-CM | POA: Diagnosis not present

## 2021-06-08 DIAGNOSIS — E538 Deficiency of other specified B group vitamins: Secondary | ICD-10-CM | POA: Insufficient documentation

## 2021-06-08 DIAGNOSIS — E559 Vitamin D deficiency, unspecified: Secondary | ICD-10-CM | POA: Insufficient documentation

## 2021-06-08 DIAGNOSIS — E1142 Type 2 diabetes mellitus with diabetic polyneuropathy: Secondary | ICD-10-CM

## 2021-06-08 DIAGNOSIS — N528 Other male erectile dysfunction: Secondary | ICD-10-CM | POA: Insufficient documentation

## 2021-06-08 DIAGNOSIS — B351 Tinea unguium: Secondary | ICD-10-CM | POA: Diagnosis not present

## 2021-06-08 DIAGNOSIS — Q613 Polycystic kidney, unspecified: Secondary | ICD-10-CM | POA: Insufficient documentation

## 2021-06-08 LAB — COLOGUARD: COLOGUARD: NEGATIVE

## 2021-06-08 NOTE — Discharge Instructions (Addendum)
No signs of infection or wax build-up on exam. Symptoms likely due to allergies causing pressure in the ears. Recommend using Flonase daily as well as a daily allergy medicine such as Zyrtec. If minimal improvement in a week or two, follow-up with ENT.

## 2021-06-08 NOTE — ED Triage Notes (Signed)
Ear pain x 2 months  Left worse than left  Now having pain at night  Denies fever  OTC Debrox

## 2021-06-08 NOTE — Progress Notes (Signed)
  Subjective:  Patient ID: Donald Alvarez, male    DOB: 20-Aug-1966,  MRN: [id number]  Chief Complaint  Patient presents with   Pain    BL plantar forefoot and BL heels pain x 2 yrs - no injury  -concern on Hammer toes -6/10 contstn pain Tx: none    55 y.o. male presents with the above complaint. History confirmed with patient.   Objective:  Physical Exam: warm, good capillary refill, nail exam thickend dystrophic nails without lesions, no trophic changes or ulcerative lesions. DP pulses palpable, PT pulses palpable, and protective sensation absent Left Foot: Hammertoes bilat. HPK 1,3,4 Right Foot: Hammertoes bilat; HPK 1,3,4  No images are attached to the encounter.  Assessment:   1. Hammertoes of both feet   2. Metatarsalgia of both feet   3. Fat pad atrophy of foot   4. DM type 2 with diabetic peripheral neuropathy (HCC)   5. Callus    Plan:  Patient was evaluated and treated and all questions answered.  Diabetes and DPN -Patient is diabetic with a qualifying condition for at risk foot care. -Would benefit from DM shoes. Will make appt for fabrication  Procedure: Nail Debridement Type of Debridement: manual, sharp debridement. Instrumentation: Nail nipper, rotary burr. Number of Nails: 2  Procedure: Paring of Lesion Rationale: painful hyperkeratotic lesion Type of Debridement: manual, sharp debridement. Instrumentation: 312 blade Number of Lesions: 6  Return for Diabetic Foot Care.

## 2021-06-08 NOTE — ED Provider Notes (Signed)
[facility]    CSN: [id number] Arrival date & time: 06/08/21  1236      History   Chief Complaint Chief Complaint  Patient presents with   Otalgia    bilateral    HPI Donald Alvarez is a 55 y.o. male.   Patient presents with concerns of bilateral ear discomfort. He has noticed it for the past 2 months or so but seems to be getting worse, particularly the left ear. He reports pressure and occasional shooting discomfort. He also reports itching inside both ears. He denies any notable change in hearing or any discharge from the ears. He has had some nasal/sinus congestion and discomfort from his ears into his neck. He thought it was from wax so he has tried wax drops and hydrogen peroxide without improvement. He reports occasional short spells of feeling a little dizzy/off balance but hasn't fallen.   The history is provided by the patient.  Otalgia Location:  Bilateral Quality:  Aching and pressure Context: not direct blow   Associated symptoms: congestion   Associated symptoms: no cough, no ear discharge, no fever, no headaches, no rash, no rhinorrhea, no sore throat and no vomiting    Past Medical History:  Diagnosis Date   Anemia    Complication of anesthesia    Pt stated that his blood preesure must be monitored closely during any surgical procedure ( per neurologist)   Diabetes mellitus without complication ([place])    ESRD on dialysis ([place])    Gout    HLD (hyperlipidemia)    Hyperparathyroidism due to renal insufficiency ([place])    Hypertension    Renal disorder     Patient Active Problem List   Diagnosis Date Noted   Low testosterone 06/08/2021   Other male erectile dysfunction 06/08/2021   Polycystic kidney disease 06/08/2021   Vitamin B12 deficiency 06/08/2021   Vitamin D deficiency 06/08/2021   Chest pain, atypical 03/14/2021   Contrast media allergy 03/14/2021   Coronary artery disease involving native coronary artery of native heart with angina  pectoris ([place]) 03/14/2021   Dependence on hemodialysis ([place]) 03/13/2021   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[place]) 03/13/2021   Kidney transplant candidate 03/13/2021   Encounter for screening for respiratory tuberculosis 02/06/2021   Diarrhea, unspecified 01/25/2021   Abnormal CT scan, chest 01/24/2021   Encounter for removal of sutures 08/21/2020   Hammertoes of both feet 03/01/2020   Numbness and tingling of both feet 03/01/2020   Onychomycosis due to dermatophyte 03/01/2020   Allergy, unspecified, initial encounter 01/29/2020   Chronic cough 12/11/2018   HAP (hospital-acquired pneumonia) 07/20/2018   Left leg numbness 05/19/2018   LVH (left ventricular hypertrophy) 05/19/2018   Hyperkalemia 09/17/2017   Disorder of phosphorus metabolism, unspecified 06/17/2017   Anemia in chronic kidney disease 06/16/2017   Dyspnea, unspecified 06/16/2017   Iron deficiency anemia 06/16/2017   Pain, unspecified 06/16/2017   Pruritus, unspecified 06/16/2017   Secondary hyperparathyroidism of renal origin ([place]) 06/16/2017   Gout 07/09/2016   HLD (hyperlipidemia) 07/09/2016   ESRD on dialysis [facility])    Chronic idiopathic gout involving toe of left foot without tophus 10/11/2014   Hypertension secondary to other renal disorders 10/11/2014   Bilateral edema of lower extremity 05/20/2014   HTN (hypertension) 05/20/2014   Diabetes mellitus without complication ([place]) Q000111Q   Polycystic kidney disease, autosomal dominant 05/20/2014   Gout flare 05/20/2014   Neuropathy in diabetes ([place]) 11/23/2013   Plantar fasciitis of left foot 11/23/2013   Callus of foot 11/23/2013  Porokeratosis 11/23/2013    Past Surgical History:  Procedure Laterality Date   AV FISTULA PLACEMENT Left 07/17/2015   Procedure: Left Upper Arm  ARTERIOVENOUS FISTULA CREATION;  Surgeon: [name], MD;  Location: [facility] OR;  Service: Vascular;  Laterality: Left;   FISTULA PLUG     NO PAST SURGERIES         Home Medications    Prior  to Admission medications   Medication Sig Start Date End Date Taking? Authorizing Provider  allopurinol (ZYLOPRIM) 100 MG tablet Take 1 tablet (100 mg total) by mouth daily. 05/18/21   [name], NP  amLODipine (NORVASC) 10 MG tablet Take 10 mg by mouth daily.    [provider]  aspirin 81 MG tablet Take 81 mg by mouth daily.    [provider]  atorvastatin (LIPITOR) 40 MG tablet Take 1 tablet by mouth daily. 03/14/21   [provider]  B Complex-C-Folic Acid (DIALYVITE Q000111Q) 0.8 MG TABS Take 1 tablet by mouth daily as needed. 11/16/18   [provider]  carvedilol (COREG) 25 MG tablet Take 25 mg by mouth 2 (two) times daily with a meal.    [provider]  cinacalcet (SENSIPAR) 30 MG tablet Take 30 mg by mouth daily. 12/21/20   [provider]  ferric citrate (AURYXIA) 1 GM 210 MG(Fe) tablet Take 210 mg by mouth with breakfast, with lunch, and with evening meal. 06/20/20   [provider]  hydrALAZINE (APRESOLINE) 100 MG tablet Take 100 mg by mouth 2 (two) times daily. 07/24/18   [provider]  lanthanum (FOSRENOL) 1000 MG chewable tablet Chew 1,000 mg by mouth in the morning, at noon, and at bedtime. 04/06/20   [provider]  lidocaine-prilocaine (EMLA) cream Apply 1 application topically See admin instructions. With dialysis Tuesday,thursday,saturday 11/23/20   [provider]  LOKELMA 10 g PACK packet Take 1 packet by mouth daily. 12/05/20   [provider]  NOVOLOG MIX 70/30 (70-30) 100 UNIT/ML injection Inject 10 Units into the skin 2 (two) times daily as needed. 09/20/20   [provider]  Respiratory Therapy Supplies (FLUTTER) DEVI 1 Device by Does not apply route as needed. 09/16/19   Lauraine Rinne, NP  traZODone (DESYREL) 100 MG tablet Take 100 mg by mouth at bedtime as needed for sleep. 10/29/20   [provider]  triamcinolone ointment (KENALOG) 0.1 % Apply 1 application  topically 2 (two) times daily. To affected areas 05/18/21   [name], NP    Family History Family History  Problem Relation Age of Onset   Diabetes Mellitus II Mother    Kidney disease Mother    Diabetes Mother    Hypertension Mother    CAD Father    Heart disease Father    Hypertension Father     Social History Social History   Tobacco Use   Smoking status: Never   Smokeless tobacco: Never  Vaping Use   Vaping Use: Never used  Substance Use Topics   Alcohol use: Never   Drug use: Never     Allergies   Iodinated diagnostic agents, Lisinopril, Other, and Ultrasound gel   Review of Systems Review of Systems  Constitutional:  Negative for fatigue and fever.  HENT:  Positive for congestion, ear pain and sinus pressure. Negative for ear discharge, rhinorrhea and sore throat.   Respiratory:  Negative for cough.   Gastrointestinal:  Negative for nausea and vomiting.  Skin:  Negative for rash.  Neurological:  Negative for syncope, weakness and headaches.    Physical Exam Triage Vital Signs ED Triage Vitals  Enc Vitals Group     BP 06/08/21 1249 (!) 151/82     Pulse Rate 06/08/21 1249 80     Resp 06/08/21 1249 16     Temp 06/08/21 1249 98.4 F (36.9 C)     Temp Source 06/08/21 1249 Oral     SpO2 06/08/21 1249 96 %     Weight --      Height --      Head Circumference --      Peak Flow --      Pain Score 06/08/21 1251 3     Pain Loc --      Pain Edu? --      Excl. in [place]? --    No data found.  Updated Vital Signs BP (!) 151/82 (BP Location: Right Arm) Comment (BP Location): no BP on left arm  Pulse 80   Temp 98.4 F (36.9 C) (Oral)   Resp 16   SpO2 96%   Visual Acuity Right Eye Distance:   Left Eye Distance:   Bilateral Distance:    Right Eye Near:   Left Eye Near:    Bilateral Near:     Physical Exam Vitals and nursing note reviewed.  Constitutional:      General: He is not in acute distress. HENT:     Right Ear: Tympanic membrane, ear  canal and external ear normal. No drainage, swelling or tenderness. There is no impacted cerumen. No mastoid tenderness. Tympanic membrane is not injected, erythematous or bulging.     Left Ear: Tympanic membrane, ear canal and external ear normal. No drainage, swelling or tenderness. There is no impacted cerumen. No mastoid tenderness. Tympanic membrane is not injected, erythematous or bulging.     Nose: No rhinorrhea.     Mouth/Throat:     Mouth: Mucous membranes are moist.     Pharynx: No oropharyngeal exudate or posterior oropharyngeal erythema.  Eyes:     Conjunctiva/sclera: Conjunctivae normal.     Pupils: Pupils are equal, round, and reactive to light.  Cardiovascular:     Rate and Rhythm: Normal rate and regular rhythm.     Heart sounds: Normal heart sounds.  Pulmonary:     Effort: Pulmonary effort is normal.     Breath sounds: Normal breath sounds.  Lymphadenopathy:     Cervical: No cervical adenopathy.  Skin:    Findings: No rash.  Neurological:     Mental Status: He is alert.  Psychiatric:        Mood and Affect: Mood normal.     UC Treatments / Results  Labs (all labs ordered are listed, but only abnormal results are displayed) Labs Reviewed - No data to display  EKG   Radiology No results found.  Procedures Procedures (including critical care time)  Medications Ordered in UC Medications - No data to display  Initial Impression / Assessment and Plan / UC Course  I have reviewed the triage vital signs and the nursing notes.  Pertinent labs & imaging results that were available during my care of the patient were reviewed by me and considered in my medical decision making (see chart for details).     No signs of infection or cerumen impaction on exam. Consistent with ETD, discussed trying Flonase and antihistamine as suspect allergy source, and f/u with ENT if no improvement.   E/M: 1 acute uncomplicated illness,  no data, low risk   Final Clinical  Impressions(s) / UC Diagnoses   Final diagnoses:  Eustachian tube dysfunction, bilateral     Discharge Instructions      No signs of infection or wax build-up on exam. Symptoms likely due to allergies causing pressure in the ears. Recommend using Flonase daily as well as a daily allergy medicine such as Zyrtec. If minimal improvement in a week or two, follow-up with ENT.      ED Prescriptions   None    PDMP not reviewed this encounter.   Delsa Sale, [place] 06/08/21 1312

## 2021-06-10 ENCOUNTER — Encounter: Payer: Self-pay | Admitting: Medical-Surgical

## 2021-06-11 ENCOUNTER — Other Ambulatory Visit: Payer: Self-pay

## 2021-06-11 ENCOUNTER — Ambulatory Visit (INDEPENDENT_AMBULATORY_CARE_PROVIDER_SITE_OTHER): Payer: Medicare Other | Admitting: Otolaryngology

## 2021-06-11 DIAGNOSIS — J310 Chronic rhinitis: Secondary | ICD-10-CM | POA: Diagnosis not present

## 2021-06-11 DIAGNOSIS — K219 Gastro-esophageal reflux disease without esophagitis: Secondary | ICD-10-CM | POA: Diagnosis not present

## 2021-06-11 MED ORDER — OMEPRAZOLE 40 MG PO CPDR: 40.0000 mg | DELAYED_RELEASE_CAPSULE | Freq: Every day | ORAL | 2 refills | Status: DC

## 2021-06-11 NOTE — Progress Notes (Signed)
HPI: Donald Alvarez is a 55 y.o. male who returns today for evaluation of allergy type symptoms.  He complains of tearing from his right as well as itching in the right.  He just recently saw his ophthalmologist who prescribed allergy eyedrops but he has not obtained them yet.  He has been doing better since using the omeprazole for reflux concerning his throat symptoms.  He also complains of postnasal drainage and mucus production which is generally clear although he occasionally coughs up some thick mucus.  He has been using the Nasacort spray.  He also today complains of itching in his ears that is deeper than where he can put his fingers. Of note he had a CT scan performed in March of this year that showed opacification of minimal right anterior ethmoid and a small right frontal sinus but clear paranasal sinuses otherwise.  He was treated with a round of Augmentin.Marland Kitchen He returns today for follow-up.  Past Medical History:  Diagnosis Date   Anemia    Complication of anesthesia    Pt stated that his blood preesure must be monitored closely during any surgical procedure ( per neurologist)   Diabetes mellitus without complication ([place])    ESRD on dialysis ([place])    Gout    HLD (hyperlipidemia)    Hyperparathyroidism due to renal insufficiency ([place])    Hypertension    Renal disorder    Past Surgical History:  Procedure Laterality Date   AV FISTULA PLACEMENT Left 07/17/2015   Procedure: Left Upper Arm  ARTERIOVENOUS FISTULA CREATION;  Surgeon: [name], MD;  Location: [facility] OR;  Service: Vascular;  Laterality: Left;   FISTULA PLUG     NO PAST SURGERIES     Social History   Socioeconomic History   Marital status: Married    Spouse name: Not on file   Number of children: Not on file   Years of education: Not on file   Highest education level: Not on file  Occupational History   Not on file  Tobacco Use   Smoking status: Never   Smokeless tobacco: Never  Vaping Use   Vaping Use:  Never used  Substance and Sexual Activity   Alcohol use: Never   Drug use: Never   Sexual activity: Yes    Birth control/protection: None  Other Topics Concern   Not on file  Social History Narrative   Not on file   Social Determinants of Health   Financial Resource Strain: Not on file  Food Insecurity: Not on file  Transportation Needs: Not on file  Physical Activity: Not on file  Stress: Not on file  Social Connections: Not on file   Family History  Problem Relation Age of Onset   Diabetes Mellitus II Mother    Kidney disease Mother    Diabetes Mother    Hypertension Mother    CAD Father    Heart disease Father    Hypertension Father    Allergies  Allergen Reactions   Iodinated Diagnostic Agents Hives and Anaphylaxis   Lisinopril Hives    Other reaction(s): Cough, Other (See Comments) cough    Other Other (See Comments)    Itching and rash from "ultrasound gel"   Ultrasound Gel Itching and Rash   Prior to Admission medications   Medication Sig Start Date End Date Taking? Authorizing Provider  allopurinol (ZYLOPRIM) 100 MG tablet Take 1 tablet (100 mg total) by mouth daily. 05/18/21   [name], NP  amLODipine ([place]) 10  MG tablet Take 10 mg by mouth daily.    [provider]  aspirin 81 MG tablet Take 81 mg by mouth daily.    [provider]  atorvastatin (LIPITOR) 40 MG tablet Take 1 tablet by mouth daily. 03/14/21   [provider]  B Complex-C-Folic Acid (DIALYVITE Q000111Q) 0.8 MG TABS Take 1 tablet by mouth daily as needed. 11/16/18   [provider]  carvedilol (COREG) 25 MG tablet Take 25 mg by mouth 2 (two) times daily with a meal.    [provider]  cinacalcet (SENSIPAR) 30 MG tablet Take 30 mg by mouth daily. 12/21/20   [provider]  ferric citrate (AURYXIA) 1 GM 210 MG(Fe) tablet Take 210 mg by mouth with breakfast, with lunch, and with evening meal. 06/20/20   [provider]  hydrALAZINE  (APRESOLINE) 100 MG tablet Take 100 mg by mouth 2 (two) times daily. 07/24/18   [provider]  lanthanum (FOSRENOL) 1000 MG chewable tablet Chew 1,000 mg by mouth in the morning, at noon, and at bedtime. 04/06/20   [provider]  lidocaine-prilocaine (EMLA) cream Apply 1 application topically See admin instructions. With dialysis Tuesday,thursday,saturday 11/23/20   [provider]  LOKELMA 10 g PACK packet Take 1 packet by mouth daily. 12/05/20   [provider]  NOVOLOG MIX 70/30 (70-30) 100 UNIT/ML injection Inject 10 Units into the skin 2 (two) times daily as needed. 09/20/20   [provider]  Respiratory Therapy Supplies (FLUTTER) DEVI 1 Device by Does not apply route as needed. 09/16/19   Lauraine Rinne, NP  traZODone (DESYREL) 100 MG tablet Take 100 mg by mouth at bedtime as needed for sleep. 10/29/20   [provider]  triamcinolone ointment (KENALOG) 0.1 % Apply 1 application topically 2 (two) times daily. To affected areas 05/18/21   [name], NP     Positive ROS: Otherwise negative  All other systems have been reviewed and were otherwise negative with the exception of those mentioned in the HPI and as above.  Physical Exam: Constitutional: Alert, well-appearing, no acute distress Ears: External ears without lesions or tenderness. Ear canals are clear bilaterally with clear TMs bilaterally.  No inflammatory changes within either ear canal.   Nasal: External nose without lesions. Septum slightly deviated to the left.  Right middle meatus is slightly edematous but no mucopurulent discharge noted.  No polyps noted.  Both nasal cavities are clear otherwise with no clinical evidence of active infection Oral: Lips and gums without lesions. Tongue and palate mucosa without lesions. Posterior oropharynx clear. Neck: No palpable adenopathy or masses Respiratory: Breathing comfortably  Skin: No facial/neck lesions or rash  noted.  Procedures  Assessment: Allergies are probably contributing to his complaints of itching and watering of the right as well as his ears. He does have mild rhinitis and minimal sinus disease noted on CT scan in March of this year.  Plan: Recommended continue use of the Nasacort 2 sprays each nostril at night as well as the acid reflux medication and will call in a refill for the acid reflux medication. Will prescribed fluocinolone oil drops for his ears as needed itching.   Radene Journey, MD

## 2021-06-18 ENCOUNTER — Other Ambulatory Visit: Payer: Self-pay | Admitting: Podiatry

## 2021-06-18 DIAGNOSIS — M7741 Metatarsalgia, right foot: Secondary | ICD-10-CM

## 2021-06-18 DIAGNOSIS — M722 Plantar fascial fibromatosis: Secondary | ICD-10-CM

## 2021-07-05 ENCOUNTER — Encounter: Payer: Self-pay | Admitting: Medical-Surgical

## 2021-07-05 NOTE — Telephone Encounter (Signed)
Mr. Toutant,  I spoke with Meridith and faxed her the results of the Cologuard.    Please let us know if we can be of further assistance.  Wishing you well, Kenney Houseman, CMA ([place]) [place]

## 2021-07-13 ENCOUNTER — Encounter: Payer: Self-pay | Admitting: Medical-Surgical

## 2021-07-13 MED ORDER — PEN NEEDLES 31G X 5 MM MISC: 99 refills | Status: AC

## 2021-07-30 ENCOUNTER — Other Ambulatory Visit: Payer: Medicare Other

## 2021-08-03 ENCOUNTER — Other Ambulatory Visit: Payer: Medicare Other

## 2021-08-13 ENCOUNTER — Ambulatory Visit (INDEPENDENT_AMBULATORY_CARE_PROVIDER_SITE_OTHER): Payer: Medicare Other | Admitting: Podiatry

## 2021-08-13 ENCOUNTER — Other Ambulatory Visit: Payer: Self-pay

## 2021-08-13 DIAGNOSIS — M2042 Other hammer toe(s) (acquired), left foot: Secondary | ICD-10-CM

## 2021-08-13 DIAGNOSIS — E1142 Type 2 diabetes mellitus with diabetic polyneuropathy: Secondary | ICD-10-CM

## 2021-08-13 DIAGNOSIS — M2041 Other hammer toe(s) (acquired), right foot: Secondary | ICD-10-CM

## 2021-08-13 DIAGNOSIS — M7741 Metatarsalgia, right foot: Secondary | ICD-10-CM

## 2021-08-13 DIAGNOSIS — M7742 Metatarsalgia, left foot: Secondary | ICD-10-CM

## 2021-08-13 NOTE — Progress Notes (Signed)
Patient presented for foam casting for 3 pair custom diabetic shoe inserts-Patient is to be a size 11 1/2 wide  Diabetic shoes are chosen from the safe step catalog and the shoes chosen are first choice 585 and the second choice is LT 210  The patient will be contacted when the shoes and inserts are ready to be picked up

## 2021-08-18 DIAGNOSIS — E1122 Type 2 diabetes mellitus with diabetic chronic kidney disease: Secondary | ICD-10-CM | POA: Diagnosis not present

## 2021-08-18 DIAGNOSIS — Z992 Dependence on renal dialysis: Secondary | ICD-10-CM | POA: Diagnosis not present

## 2021-08-18 DIAGNOSIS — N186 End stage renal disease: Secondary | ICD-10-CM | POA: Diagnosis not present

## 2021-08-20 DIAGNOSIS — Q612 Polycystic kidney, adult type: Secondary | ICD-10-CM | POA: Diagnosis not present

## 2021-08-20 DIAGNOSIS — I671 Cerebral aneurysm, nonruptured: Secondary | ICD-10-CM | POA: Diagnosis not present

## 2021-08-20 DIAGNOSIS — E1122 Type 2 diabetes mellitus with diabetic chronic kidney disease: Secondary | ICD-10-CM | POA: Diagnosis not present

## 2021-08-20 DIAGNOSIS — N186 End stage renal disease: Secondary | ICD-10-CM | POA: Diagnosis not present

## 2021-08-20 DIAGNOSIS — Z794 Long term (current) use of insulin: Secondary | ICD-10-CM | POA: Diagnosis not present

## 2021-08-20 DIAGNOSIS — I2510 Atherosclerotic heart disease of native coronary artery without angina pectoris: Secondary | ICD-10-CM | POA: Diagnosis not present

## 2021-08-20 DIAGNOSIS — Z7682 Awaiting organ transplant status: Secondary | ICD-10-CM | POA: Diagnosis not present

## 2021-08-20 DIAGNOSIS — Z01818 Encounter for other preprocedural examination: Secondary | ICD-10-CM | POA: Diagnosis not present

## 2021-08-20 DIAGNOSIS — Z992 Dependence on renal dialysis: Secondary | ICD-10-CM | POA: Diagnosis not present

## 2021-08-21 DIAGNOSIS — Z992 Dependence on renal dialysis: Secondary | ICD-10-CM | POA: Diagnosis not present

## 2021-08-21 DIAGNOSIS — N2581 Secondary hyperparathyroidism of renal origin: Secondary | ICD-10-CM | POA: Diagnosis not present

## 2021-08-21 DIAGNOSIS — N186 End stage renal disease: Secondary | ICD-10-CM | POA: Diagnosis not present

## 2021-08-21 DIAGNOSIS — Z7682 Awaiting organ transplant status: Secondary | ICD-10-CM | POA: Diagnosis not present

## 2021-08-23 DIAGNOSIS — Z992 Dependence on renal dialysis: Secondary | ICD-10-CM | POA: Diagnosis not present

## 2021-08-23 DIAGNOSIS — N186 End stage renal disease: Secondary | ICD-10-CM | POA: Diagnosis not present

## 2021-08-23 DIAGNOSIS — N2581 Secondary hyperparathyroidism of renal origin: Secondary | ICD-10-CM | POA: Diagnosis not present

## 2021-08-25 DIAGNOSIS — N2581 Secondary hyperparathyroidism of renal origin: Secondary | ICD-10-CM | POA: Diagnosis not present

## 2021-08-25 DIAGNOSIS — N186 End stage renal disease: Secondary | ICD-10-CM | POA: Diagnosis not present

## 2021-08-25 DIAGNOSIS — Z992 Dependence on renal dialysis: Secondary | ICD-10-CM | POA: Diagnosis not present

## 2021-08-29 DIAGNOSIS — N2581 Secondary hyperparathyroidism of renal origin: Secondary | ICD-10-CM | POA: Diagnosis not present

## 2021-08-29 DIAGNOSIS — Z992 Dependence on renal dialysis: Secondary | ICD-10-CM | POA: Diagnosis not present

## 2021-08-29 DIAGNOSIS — Z1152 Encounter for screening for COVID-19: Secondary | ICD-10-CM | POA: Diagnosis not present

## 2021-08-29 DIAGNOSIS — N186 End stage renal disease: Secondary | ICD-10-CM | POA: Diagnosis not present

## 2021-09-04 DIAGNOSIS — N186 End stage renal disease: Secondary | ICD-10-CM | POA: Diagnosis not present

## 2021-09-04 DIAGNOSIS — Z992 Dependence on renal dialysis: Secondary | ICD-10-CM | POA: Diagnosis not present

## 2021-09-04 DIAGNOSIS — N2581 Secondary hyperparathyroidism of renal origin: Secondary | ICD-10-CM | POA: Diagnosis not present

## 2021-09-06 DIAGNOSIS — Z992 Dependence on renal dialysis: Secondary | ICD-10-CM | POA: Diagnosis not present

## 2021-09-06 DIAGNOSIS — N186 End stage renal disease: Secondary | ICD-10-CM | POA: Diagnosis not present

## 2021-09-06 DIAGNOSIS — N2581 Secondary hyperparathyroidism of renal origin: Secondary | ICD-10-CM | POA: Diagnosis not present

## 2021-09-08 DIAGNOSIS — Z992 Dependence on renal dialysis: Secondary | ICD-10-CM | POA: Diagnosis not present

## 2021-09-08 DIAGNOSIS — N186 End stage renal disease: Secondary | ICD-10-CM | POA: Diagnosis not present

## 2021-09-08 DIAGNOSIS — N2581 Secondary hyperparathyroidism of renal origin: Secondary | ICD-10-CM | POA: Diagnosis not present

## 2021-09-11 DIAGNOSIS — N186 End stage renal disease: Secondary | ICD-10-CM | POA: Diagnosis not present

## 2021-09-11 DIAGNOSIS — Z992 Dependence on renal dialysis: Secondary | ICD-10-CM | POA: Diagnosis not present

## 2021-09-11 DIAGNOSIS — N2581 Secondary hyperparathyroidism of renal origin: Secondary | ICD-10-CM | POA: Diagnosis not present

## 2021-09-13 DIAGNOSIS — N186 End stage renal disease: Secondary | ICD-10-CM | POA: Diagnosis not present

## 2021-09-13 DIAGNOSIS — Z992 Dependence on renal dialysis: Secondary | ICD-10-CM | POA: Diagnosis not present

## 2021-09-13 DIAGNOSIS — N2581 Secondary hyperparathyroidism of renal origin: Secondary | ICD-10-CM | POA: Diagnosis not present

## 2021-09-14 ENCOUNTER — Other Ambulatory Visit: Payer: Self-pay

## 2021-09-14 ENCOUNTER — Ambulatory Visit (INDEPENDENT_AMBULATORY_CARE_PROVIDER_SITE_OTHER): Payer: Medicare HMO | Admitting: Podiatry

## 2021-09-14 ENCOUNTER — Ambulatory Visit: Payer: Medicare Other | Admitting: Podiatry

## 2021-09-14 DIAGNOSIS — L84 Corns and callosities: Secondary | ICD-10-CM

## 2021-09-14 DIAGNOSIS — E1142 Type 2 diabetes mellitus with diabetic polyneuropathy: Secondary | ICD-10-CM | POA: Diagnosis not present

## 2021-09-14 DIAGNOSIS — B351 Tinea unguium: Secondary | ICD-10-CM | POA: Diagnosis not present

## 2021-09-14 NOTE — Progress Notes (Signed)
  Subjective:  Patient ID: Donald Alvarez, male    DOB: 09-Jan-1966,  MRN: [id number]  Chief Complaint  Patient presents with   Callouses    Nail and callus trim needed. B/L callus noted x3. Painful    55 y.o. male presents with the above complaint. History confirmed with patient. Denies new pedal issues.  Objective:  Physical Exam: warm, good capillary refill, nail exam thickend dystrophic nails without lesions, no trophic changes or ulcerative lesions. DP pulses palpable, PT pulses palpable, and protective sensation absent Left Foot: Hammertoes bilat. HPK 1,3,4 Right Foot: Hammertoes bilat; HPK 1,3,4  No images are attached to the encounter.  Assessment:   1. DM type 2 with diabetic peripheral neuropathy (HCC)   2. Callus   3. Onychomycosis    Plan:  Patient was evaluated and treated and all questions answered.  Diabetes and DPN -Patient is diabetic with a qualifying condition for at risk foot care. -Awaiting DM shoes  Procedure: Nail Debridement Type of Debridement: manual, sharp debridement. Instrumentation: Nail nipper, rotary burr. Number of Nails: 10   Procedure: Paring of Lesion Rationale: painful hyperkeratotic lesion Type of Debridement: manual, sharp debridement. Instrumentation: 312 blade Number of Lesions: 6   No follow-ups on file.

## 2021-09-15 DIAGNOSIS — N2581 Secondary hyperparathyroidism of renal origin: Secondary | ICD-10-CM | POA: Diagnosis not present

## 2021-09-15 DIAGNOSIS — N186 End stage renal disease: Secondary | ICD-10-CM | POA: Diagnosis not present

## 2021-09-15 DIAGNOSIS — Z992 Dependence on renal dialysis: Secondary | ICD-10-CM | POA: Diagnosis not present

## 2021-09-18 DIAGNOSIS — E1122 Type 2 diabetes mellitus with diabetic chronic kidney disease: Secondary | ICD-10-CM | POA: Diagnosis not present

## 2021-09-18 DIAGNOSIS — N186 End stage renal disease: Secondary | ICD-10-CM | POA: Diagnosis not present

## 2021-09-18 DIAGNOSIS — Z992 Dependence on renal dialysis: Secondary | ICD-10-CM | POA: Diagnosis not present

## 2021-09-19 DIAGNOSIS — N186 End stage renal disease: Secondary | ICD-10-CM | POA: Diagnosis not present

## 2021-09-19 DIAGNOSIS — Z992 Dependence on renal dialysis: Secondary | ICD-10-CM | POA: Diagnosis not present

## 2021-09-19 DIAGNOSIS — N2581 Secondary hyperparathyroidism of renal origin: Secondary | ICD-10-CM | POA: Diagnosis not present

## 2021-09-21 DIAGNOSIS — N2581 Secondary hyperparathyroidism of renal origin: Secondary | ICD-10-CM | POA: Diagnosis not present

## 2021-09-21 DIAGNOSIS — Z992 Dependence on renal dialysis: Secondary | ICD-10-CM | POA: Diagnosis not present

## 2021-09-21 DIAGNOSIS — N186 End stage renal disease: Secondary | ICD-10-CM | POA: Diagnosis not present

## 2021-09-25 DIAGNOSIS — N2581 Secondary hyperparathyroidism of renal origin: Secondary | ICD-10-CM | POA: Diagnosis not present

## 2021-09-25 DIAGNOSIS — N186 End stage renal disease: Secondary | ICD-10-CM | POA: Diagnosis not present

## 2021-09-25 DIAGNOSIS — Z992 Dependence on renal dialysis: Secondary | ICD-10-CM | POA: Diagnosis not present

## 2021-09-27 DIAGNOSIS — N2581 Secondary hyperparathyroidism of renal origin: Secondary | ICD-10-CM | POA: Diagnosis not present

## 2021-09-27 DIAGNOSIS — N186 End stage renal disease: Secondary | ICD-10-CM | POA: Diagnosis not present

## 2021-09-27 DIAGNOSIS — Z992 Dependence on renal dialysis: Secondary | ICD-10-CM | POA: Diagnosis not present

## 2021-09-29 DIAGNOSIS — Z992 Dependence on renal dialysis: Secondary | ICD-10-CM | POA: Diagnosis not present

## 2021-09-29 DIAGNOSIS — N186 End stage renal disease: Secondary | ICD-10-CM | POA: Diagnosis not present

## 2021-09-29 DIAGNOSIS — N2581 Secondary hyperparathyroidism of renal origin: Secondary | ICD-10-CM | POA: Diagnosis not present

## 2021-10-02 DIAGNOSIS — N186 End stage renal disease: Secondary | ICD-10-CM | POA: Diagnosis not present

## 2021-10-02 DIAGNOSIS — N2581 Secondary hyperparathyroidism of renal origin: Secondary | ICD-10-CM | POA: Diagnosis not present

## 2021-10-02 DIAGNOSIS — Z992 Dependence on renal dialysis: Secondary | ICD-10-CM | POA: Diagnosis not present

## 2021-10-04 DIAGNOSIS — Z992 Dependence on renal dialysis: Secondary | ICD-10-CM | POA: Diagnosis not present

## 2021-10-04 DIAGNOSIS — N2581 Secondary hyperparathyroidism of renal origin: Secondary | ICD-10-CM | POA: Diagnosis not present

## 2021-10-04 DIAGNOSIS — N186 End stage renal disease: Secondary | ICD-10-CM | POA: Diagnosis not present

## 2021-10-06 DIAGNOSIS — N2581 Secondary hyperparathyroidism of renal origin: Secondary | ICD-10-CM | POA: Diagnosis not present

## 2021-10-06 DIAGNOSIS — N186 End stage renal disease: Secondary | ICD-10-CM | POA: Diagnosis not present

## 2021-10-06 DIAGNOSIS — Z992 Dependence on renal dialysis: Secondary | ICD-10-CM | POA: Diagnosis not present

## 2021-10-09 DIAGNOSIS — T782XXA Anaphylactic shock, unspecified, initial encounter: Secondary | ICD-10-CM | POA: Insufficient documentation

## 2021-10-09 DIAGNOSIS — N186 End stage renal disease: Secondary | ICD-10-CM | POA: Diagnosis not present

## 2021-10-09 DIAGNOSIS — N2581 Secondary hyperparathyroidism of renal origin: Secondary | ICD-10-CM | POA: Diagnosis not present

## 2021-10-09 DIAGNOSIS — Z992 Dependence on renal dialysis: Secondary | ICD-10-CM | POA: Diagnosis not present

## 2021-10-09 HISTORY — DX: Anaphylactic shock, unspecified, initial encounter: T78.2XXA

## 2021-10-12 DIAGNOSIS — N2581 Secondary hyperparathyroidism of renal origin: Secondary | ICD-10-CM | POA: Diagnosis not present

## 2021-10-12 DIAGNOSIS — N186 End stage renal disease: Secondary | ICD-10-CM | POA: Diagnosis not present

## 2021-10-12 DIAGNOSIS — Z992 Dependence on renal dialysis: Secondary | ICD-10-CM | POA: Diagnosis not present

## 2021-10-15 DIAGNOSIS — Z992 Dependence on renal dialysis: Secondary | ICD-10-CM | POA: Diagnosis not present

## 2021-10-15 DIAGNOSIS — N186 End stage renal disease: Secondary | ICD-10-CM | POA: Diagnosis not present

## 2021-10-15 DIAGNOSIS — N2581 Secondary hyperparathyroidism of renal origin: Secondary | ICD-10-CM | POA: Diagnosis not present

## 2021-10-16 DIAGNOSIS — N186 End stage renal disease: Secondary | ICD-10-CM | POA: Diagnosis not present

## 2021-10-16 DIAGNOSIS — Z992 Dependence on renal dialysis: Secondary | ICD-10-CM | POA: Diagnosis not present

## 2021-10-16 DIAGNOSIS — N2581 Secondary hyperparathyroidism of renal origin: Secondary | ICD-10-CM | POA: Diagnosis not present

## 2021-10-18 DIAGNOSIS — N2581 Secondary hyperparathyroidism of renal origin: Secondary | ICD-10-CM | POA: Diagnosis not present

## 2021-10-18 DIAGNOSIS — N186 End stage renal disease: Secondary | ICD-10-CM | POA: Diagnosis not present

## 2021-10-18 DIAGNOSIS — Z992 Dependence on renal dialysis: Secondary | ICD-10-CM | POA: Diagnosis not present

## 2021-10-20 DIAGNOSIS — Z992 Dependence on renal dialysis: Secondary | ICD-10-CM | POA: Diagnosis not present

## 2021-10-20 DIAGNOSIS — N186 End stage renal disease: Secondary | ICD-10-CM | POA: Diagnosis not present

## 2021-10-20 DIAGNOSIS — N2581 Secondary hyperparathyroidism of renal origin: Secondary | ICD-10-CM | POA: Diagnosis not present

## 2021-10-23 DIAGNOSIS — Z992 Dependence on renal dialysis: Secondary | ICD-10-CM | POA: Diagnosis not present

## 2021-10-23 DIAGNOSIS — N186 End stage renal disease: Secondary | ICD-10-CM | POA: Diagnosis not present

## 2021-10-23 DIAGNOSIS — N2581 Secondary hyperparathyroidism of renal origin: Secondary | ICD-10-CM | POA: Diagnosis not present

## 2021-10-24 ENCOUNTER — Telehealth: Payer: Self-pay | Admitting: Podiatry

## 2021-10-24 NOTE — Telephone Encounter (Signed)
Pt stated he just left message but called back checking on diabetic shoe status.  Upon checking we have not gotten the documents from the doctor Osei Bonsu. Notified pt and he stated he has changed doctors and now is seeing [name] NP @ [facility]. I told pt I would call and see if I could get the name of her overseeing md/do as medicare will not allow a NP to sign the forms and I would fax it over there. He said thank you.   Dr Luetta Nutting is the overseeing Do and I have faxed documents to that office.

## 2021-10-27 DIAGNOSIS — N186 End stage renal disease: Secondary | ICD-10-CM | POA: Diagnosis not present

## 2021-10-27 DIAGNOSIS — N2581 Secondary hyperparathyroidism of renal origin: Secondary | ICD-10-CM | POA: Diagnosis not present

## 2021-10-27 DIAGNOSIS — Z992 Dependence on renal dialysis: Secondary | ICD-10-CM | POA: Diagnosis not present

## 2021-10-30 DIAGNOSIS — N2581 Secondary hyperparathyroidism of renal origin: Secondary | ICD-10-CM | POA: Diagnosis not present

## 2021-10-30 DIAGNOSIS — N186 End stage renal disease: Secondary | ICD-10-CM | POA: Diagnosis not present

## 2021-10-30 DIAGNOSIS — Z992 Dependence on renal dialysis: Secondary | ICD-10-CM | POA: Diagnosis not present

## 2021-11-01 ENCOUNTER — Telehealth: Payer: Self-pay

## 2021-11-01 ENCOUNTER — Telehealth: Payer: Self-pay | Admitting: Podiatry

## 2021-11-01 NOTE — Telephone Encounter (Signed)
Forms for diabetic shoes signed and faxed with confirmation. Pt made aware.

## 2021-11-01 NOTE — Telephone Encounter (Signed)
Pt left message stating he called [name] NP office and they are saying they have not gotten the diabetic shoe forms. He asked me to fax it to  [phone]. And to let him know when I got the message.  I returned call and have faxed the documents to the number he left for me and explained that the number we were faxing it to is [employer] system under the provider's contact. He is concerned about them expiring. I explained that the documents signature is what expires and that once we get the documents it takes about 2 to 3 wks to get the shoes and inserts.

## 2021-11-03 DIAGNOSIS — Z992 Dependence on renal dialysis: Secondary | ICD-10-CM | POA: Diagnosis not present

## 2021-11-03 DIAGNOSIS — N2581 Secondary hyperparathyroidism of renal origin: Secondary | ICD-10-CM | POA: Diagnosis not present

## 2021-11-03 DIAGNOSIS — N186 End stage renal disease: Secondary | ICD-10-CM | POA: Diagnosis not present

## 2021-11-06 DIAGNOSIS — N186 End stage renal disease: Secondary | ICD-10-CM | POA: Diagnosis not present

## 2021-11-06 DIAGNOSIS — N2581 Secondary hyperparathyroidism of renal origin: Secondary | ICD-10-CM | POA: Diagnosis not present

## 2021-11-06 DIAGNOSIS — Z992 Dependence on renal dialysis: Secondary | ICD-10-CM | POA: Diagnosis not present

## 2021-11-10 DIAGNOSIS — N186 End stage renal disease: Secondary | ICD-10-CM | POA: Diagnosis not present

## 2021-11-10 DIAGNOSIS — N2581 Secondary hyperparathyroidism of renal origin: Secondary | ICD-10-CM | POA: Diagnosis not present

## 2021-11-10 DIAGNOSIS — Z992 Dependence on renal dialysis: Secondary | ICD-10-CM | POA: Diagnosis not present

## 2021-11-13 DIAGNOSIS — N2581 Secondary hyperparathyroidism of renal origin: Secondary | ICD-10-CM | POA: Diagnosis not present

## 2021-11-13 DIAGNOSIS — N186 End stage renal disease: Secondary | ICD-10-CM | POA: Diagnosis not present

## 2021-11-13 DIAGNOSIS — Z992 Dependence on renal dialysis: Secondary | ICD-10-CM | POA: Diagnosis not present

## 2021-11-15 DIAGNOSIS — N186 End stage renal disease: Secondary | ICD-10-CM | POA: Diagnosis not present

## 2021-11-15 DIAGNOSIS — N2581 Secondary hyperparathyroidism of renal origin: Secondary | ICD-10-CM | POA: Diagnosis not present

## 2021-11-15 DIAGNOSIS — Z992 Dependence on renal dialysis: Secondary | ICD-10-CM | POA: Diagnosis not present

## 2021-11-17 DIAGNOSIS — N186 End stage renal disease: Secondary | ICD-10-CM | POA: Diagnosis not present

## 2021-11-17 DIAGNOSIS — N2581 Secondary hyperparathyroidism of renal origin: Secondary | ICD-10-CM | POA: Diagnosis not present

## 2021-11-17 DIAGNOSIS — E1122 Type 2 diabetes mellitus with diabetic chronic kidney disease: Secondary | ICD-10-CM | POA: Diagnosis not present

## 2021-11-17 DIAGNOSIS — Z992 Dependence on renal dialysis: Secondary | ICD-10-CM | POA: Diagnosis not present

## 2021-11-20 ENCOUNTER — Telehealth: Payer: Self-pay | Admitting: Podiatry

## 2021-11-20 DIAGNOSIS — Z992 Dependence on renal dialysis: Secondary | ICD-10-CM | POA: Diagnosis not present

## 2021-11-20 DIAGNOSIS — N186 End stage renal disease: Secondary | ICD-10-CM | POA: Diagnosis not present

## 2021-11-20 DIAGNOSIS — N2581 Secondary hyperparathyroidism of renal origin: Secondary | ICD-10-CM | POA: Diagnosis not present

## 2021-11-20 NOTE — Telephone Encounter (Signed)
Pt left message last week when I was out of the office and today checking on status of diabetic shoes.  Upon checking we got the documents and they should be shipping in the next week or too. Notified pt of this.

## 2021-11-21 ENCOUNTER — Ambulatory Visit: Payer: Medicare Other | Admitting: Medical-Surgical

## 2021-11-22 DIAGNOSIS — N2581 Secondary hyperparathyroidism of renal origin: Secondary | ICD-10-CM | POA: Diagnosis not present

## 2021-11-22 DIAGNOSIS — Z992 Dependence on renal dialysis: Secondary | ICD-10-CM | POA: Diagnosis not present

## 2021-11-22 DIAGNOSIS — N186 End stage renal disease: Secondary | ICD-10-CM | POA: Diagnosis not present

## 2021-11-24 DIAGNOSIS — N186 End stage renal disease: Secondary | ICD-10-CM | POA: Diagnosis not present

## 2021-11-24 DIAGNOSIS — N2581 Secondary hyperparathyroidism of renal origin: Secondary | ICD-10-CM | POA: Diagnosis not present

## 2021-11-24 DIAGNOSIS — Z992 Dependence on renal dialysis: Secondary | ICD-10-CM | POA: Diagnosis not present

## 2021-11-27 DIAGNOSIS — Z992 Dependence on renal dialysis: Secondary | ICD-10-CM | POA: Diagnosis not present

## 2021-11-27 DIAGNOSIS — N2581 Secondary hyperparathyroidism of renal origin: Secondary | ICD-10-CM | POA: Diagnosis not present

## 2021-11-27 DIAGNOSIS — N186 End stage renal disease: Secondary | ICD-10-CM | POA: Diagnosis not present

## 2021-11-29 DIAGNOSIS — N2581 Secondary hyperparathyroidism of renal origin: Secondary | ICD-10-CM | POA: Diagnosis not present

## 2021-11-29 DIAGNOSIS — N186 End stage renal disease: Secondary | ICD-10-CM | POA: Diagnosis not present

## 2021-11-29 DIAGNOSIS — Z992 Dependence on renal dialysis: Secondary | ICD-10-CM | POA: Diagnosis not present

## 2021-11-30 ENCOUNTER — Telehealth: Payer: Self-pay | Admitting: Podiatry

## 2021-11-30 NOTE — Telephone Encounter (Signed)
Pt left message asking about status of his diabetic shoes.   Upon checking they finally received the corrected documents and the inserts are in production and it should be about 2wks and I told pt I would call pt when they come in to get him scheduled to pick them up.

## 2021-12-01 DIAGNOSIS — N2581 Secondary hyperparathyroidism of renal origin: Secondary | ICD-10-CM | POA: Diagnosis not present

## 2021-12-01 DIAGNOSIS — Z992 Dependence on renal dialysis: Secondary | ICD-10-CM | POA: Diagnosis not present

## 2021-12-01 DIAGNOSIS — N186 End stage renal disease: Secondary | ICD-10-CM | POA: Diagnosis not present

## 2021-12-04 DIAGNOSIS — N2581 Secondary hyperparathyroidism of renal origin: Secondary | ICD-10-CM | POA: Diagnosis not present

## 2021-12-04 DIAGNOSIS — Z992 Dependence on renal dialysis: Secondary | ICD-10-CM | POA: Diagnosis not present

## 2021-12-04 DIAGNOSIS — N186 End stage renal disease: Secondary | ICD-10-CM | POA: Diagnosis not present

## 2021-12-06 DIAGNOSIS — N186 End stage renal disease: Secondary | ICD-10-CM | POA: Diagnosis not present

## 2021-12-06 DIAGNOSIS — Z992 Dependence on renal dialysis: Secondary | ICD-10-CM | POA: Diagnosis not present

## 2021-12-06 DIAGNOSIS — E43 Unspecified severe protein-calorie malnutrition: Secondary | ICD-10-CM | POA: Insufficient documentation

## 2021-12-06 DIAGNOSIS — N2581 Secondary hyperparathyroidism of renal origin: Secondary | ICD-10-CM | POA: Diagnosis not present

## 2021-12-08 DIAGNOSIS — N186 End stage renal disease: Secondary | ICD-10-CM | POA: Diagnosis not present

## 2021-12-08 DIAGNOSIS — N2581 Secondary hyperparathyroidism of renal origin: Secondary | ICD-10-CM | POA: Diagnosis not present

## 2021-12-08 DIAGNOSIS — Z992 Dependence on renal dialysis: Secondary | ICD-10-CM | POA: Diagnosis not present

## 2021-12-11 DIAGNOSIS — N2581 Secondary hyperparathyroidism of renal origin: Secondary | ICD-10-CM | POA: Diagnosis not present

## 2021-12-11 DIAGNOSIS — Z992 Dependence on renal dialysis: Secondary | ICD-10-CM | POA: Diagnosis not present

## 2021-12-11 DIAGNOSIS — N186 End stage renal disease: Secondary | ICD-10-CM | POA: Diagnosis not present

## 2021-12-15 DIAGNOSIS — N186 End stage renal disease: Secondary | ICD-10-CM | POA: Diagnosis not present

## 2021-12-15 DIAGNOSIS — N2581 Secondary hyperparathyroidism of renal origin: Secondary | ICD-10-CM | POA: Diagnosis not present

## 2021-12-15 DIAGNOSIS — Z992 Dependence on renal dialysis: Secondary | ICD-10-CM | POA: Diagnosis not present

## 2021-12-18 DIAGNOSIS — N186 End stage renal disease: Secondary | ICD-10-CM | POA: Diagnosis not present

## 2021-12-18 DIAGNOSIS — E1122 Type 2 diabetes mellitus with diabetic chronic kidney disease: Secondary | ICD-10-CM | POA: Diagnosis not present

## 2021-12-18 DIAGNOSIS — Z992 Dependence on renal dialysis: Secondary | ICD-10-CM | POA: Diagnosis not present

## 2021-12-18 DIAGNOSIS — N2581 Secondary hyperparathyroidism of renal origin: Secondary | ICD-10-CM | POA: Diagnosis not present

## 2021-12-20 DIAGNOSIS — N2581 Secondary hyperparathyroidism of renal origin: Secondary | ICD-10-CM | POA: Diagnosis not present

## 2021-12-20 DIAGNOSIS — Z992 Dependence on renal dialysis: Secondary | ICD-10-CM | POA: Diagnosis not present

## 2021-12-20 DIAGNOSIS — N186 End stage renal disease: Secondary | ICD-10-CM | POA: Diagnosis not present

## 2021-12-21 ENCOUNTER — Ambulatory Visit: Payer: Medicare HMO

## 2021-12-21 ENCOUNTER — Ambulatory Visit: Payer: Medicare HMO | Admitting: Podiatry

## 2021-12-21 ENCOUNTER — Other Ambulatory Visit: Payer: Self-pay

## 2021-12-21 DIAGNOSIS — L84 Corns and callosities: Secondary | ICD-10-CM

## 2021-12-21 DIAGNOSIS — M2042 Other hammer toe(s) (acquired), left foot: Secondary | ICD-10-CM

## 2021-12-21 DIAGNOSIS — E1142 Type 2 diabetes mellitus with diabetic polyneuropathy: Secondary | ICD-10-CM

## 2021-12-21 DIAGNOSIS — M2041 Other hammer toe(s) (acquired), right foot: Secondary | ICD-10-CM

## 2021-12-21 NOTE — Progress Notes (Signed)
SITUATION Reason for Visit: Fitting of Diabetic Shoes & Insoles Patient / Caregiver Report:  Insoles do not feel correctly manufactured  OBJECTIVE DATA: Patient History / Diagnosis:     ICD-10-CM   1. DM type 2 with diabetic peripheral neuropathy ([place])  E11.42     2. Hammertoes of both feet  M20.41    M20.42     3. Callus  L84       Change in Status:   None  ACTIONS PERFORMED: Attempted to fit patient with diabetic shoes and insoles and while shoe fit is excellent, patient reports there is not enough arch support and the fit feels inadequate. Inserts are A5514 SafeStep CAM milled versions that while appropriate, do not fit as snugly as patient would like. Patient was recasted and [employer] will fabricate new [phone] versions.  PLAN Patient to be contacted when new insoles ready in approximately 4 weeks. Plan of care was discussed with and agreed upon by patient and/or caregiver. All questions were answered and concerns addressed.

## 2021-12-22 DIAGNOSIS — N2581 Secondary hyperparathyroidism of renal origin: Secondary | ICD-10-CM | POA: Diagnosis not present

## 2021-12-22 DIAGNOSIS — Z992 Dependence on renal dialysis: Secondary | ICD-10-CM | POA: Diagnosis not present

## 2021-12-22 DIAGNOSIS — N186 End stage renal disease: Secondary | ICD-10-CM | POA: Diagnosis not present

## 2021-12-25 DIAGNOSIS — N2581 Secondary hyperparathyroidism of renal origin: Secondary | ICD-10-CM | POA: Diagnosis not present

## 2021-12-25 DIAGNOSIS — N186 End stage renal disease: Secondary | ICD-10-CM | POA: Diagnosis not present

## 2021-12-25 DIAGNOSIS — Z992 Dependence on renal dialysis: Secondary | ICD-10-CM | POA: Diagnosis not present

## 2021-12-27 DIAGNOSIS — N186 End stage renal disease: Secondary | ICD-10-CM | POA: Diagnosis not present

## 2021-12-27 DIAGNOSIS — N2581 Secondary hyperparathyroidism of renal origin: Secondary | ICD-10-CM | POA: Diagnosis not present

## 2021-12-27 DIAGNOSIS — Z992 Dependence on renal dialysis: Secondary | ICD-10-CM | POA: Diagnosis not present

## 2021-12-29 DIAGNOSIS — N2581 Secondary hyperparathyroidism of renal origin: Secondary | ICD-10-CM | POA: Diagnosis not present

## 2021-12-29 DIAGNOSIS — N186 End stage renal disease: Secondary | ICD-10-CM | POA: Diagnosis not present

## 2021-12-29 DIAGNOSIS — Z992 Dependence on renal dialysis: Secondary | ICD-10-CM | POA: Diagnosis not present

## 2022-01-01 DIAGNOSIS — N2581 Secondary hyperparathyroidism of renal origin: Secondary | ICD-10-CM | POA: Diagnosis not present

## 2022-01-01 DIAGNOSIS — N186 End stage renal disease: Secondary | ICD-10-CM | POA: Diagnosis not present

## 2022-01-01 DIAGNOSIS — Z992 Dependence on renal dialysis: Secondary | ICD-10-CM | POA: Diagnosis not present

## 2022-01-03 DIAGNOSIS — N186 End stage renal disease: Secondary | ICD-10-CM | POA: Diagnosis not present

## 2022-01-03 DIAGNOSIS — Z992 Dependence on renal dialysis: Secondary | ICD-10-CM | POA: Diagnosis not present

## 2022-01-03 DIAGNOSIS — N2581 Secondary hyperparathyroidism of renal origin: Secondary | ICD-10-CM | POA: Diagnosis not present

## 2022-01-04 ENCOUNTER — Ambulatory Visit: Payer: Medicare HMO | Admitting: Podiatry

## 2022-01-04 ENCOUNTER — Other Ambulatory Visit: Payer: Self-pay

## 2022-01-04 ENCOUNTER — Ambulatory Visit (INDEPENDENT_AMBULATORY_CARE_PROVIDER_SITE_OTHER): Payer: Medicare HMO | Admitting: Podiatry

## 2022-01-04 ENCOUNTER — Encounter: Payer: Self-pay | Admitting: Podiatry

## 2022-01-04 DIAGNOSIS — N186 End stage renal disease: Secondary | ICD-10-CM

## 2022-01-04 DIAGNOSIS — L84 Corns and callosities: Secondary | ICD-10-CM

## 2022-01-04 DIAGNOSIS — E119 Type 2 diabetes mellitus without complications: Secondary | ICD-10-CM

## 2022-01-04 DIAGNOSIS — Z992 Dependence on renal dialysis: Secondary | ICD-10-CM

## 2022-01-04 DIAGNOSIS — E1142 Type 2 diabetes mellitus with diabetic polyneuropathy: Secondary | ICD-10-CM

## 2022-01-04 DIAGNOSIS — E0822 Diabetes mellitus due to underlying condition with diabetic chronic kidney disease: Secondary | ICD-10-CM

## 2022-01-04 DIAGNOSIS — M2041 Other hammer toe(s) (acquired), right foot: Secondary | ICD-10-CM | POA: Diagnosis not present

## 2022-01-04 DIAGNOSIS — Z794 Long term (current) use of insulin: Secondary | ICD-10-CM

## 2022-01-04 DIAGNOSIS — B351 Tinea unguium: Secondary | ICD-10-CM | POA: Diagnosis not present

## 2022-01-04 DIAGNOSIS — M2042 Other hammer toe(s) (acquired), left foot: Secondary | ICD-10-CM

## 2022-01-05 DIAGNOSIS — N2581 Secondary hyperparathyroidism of renal origin: Secondary | ICD-10-CM | POA: Diagnosis not present

## 2022-01-05 DIAGNOSIS — Z992 Dependence on renal dialysis: Secondary | ICD-10-CM | POA: Diagnosis not present

## 2022-01-05 DIAGNOSIS — N186 End stage renal disease: Secondary | ICD-10-CM | POA: Diagnosis not present

## 2022-01-08 DIAGNOSIS — Z992 Dependence on renal dialysis: Secondary | ICD-10-CM | POA: Diagnosis not present

## 2022-01-08 DIAGNOSIS — N186 End stage renal disease: Secondary | ICD-10-CM | POA: Diagnosis not present

## 2022-01-08 DIAGNOSIS — N2581 Secondary hyperparathyroidism of renal origin: Secondary | ICD-10-CM | POA: Diagnosis not present

## 2022-01-10 DIAGNOSIS — N186 End stage renal disease: Secondary | ICD-10-CM | POA: Diagnosis not present

## 2022-01-10 DIAGNOSIS — N2581 Secondary hyperparathyroidism of renal origin: Secondary | ICD-10-CM | POA: Diagnosis not present

## 2022-01-10 DIAGNOSIS — Z992 Dependence on renal dialysis: Secondary | ICD-10-CM | POA: Diagnosis not present

## 2022-01-10 NOTE — Progress Notes (Signed)
ANNUAL DIABETIC FOOT EXAM  Subjective: Donald Alvarez presents today for for annual diabetic foot examination.  Patient relates 6 year h/o diabetes.  Patient denies any h/o foot wounds.  Patient endorses occasional symptoms of neuropathy.  Patient's blood sugar was 97 mg/dl today.   Risk factors: diabetic neuropathy, ESRD on hemodialysis, hyperlipidemia, HTN, CAD.  Donald Bouche, NP is patient's PCP. Last visit was October, 2022.  Past Medical History:  Diagnosis Date   Anemia    Complication of anesthesia    Pt stated that his blood preesure must be monitored closely during any surgical procedure ( per neurologist)   Diabetes mellitus without complication (Donald Alvarez)    ESRD on dialysis (Donald Alvarez)    Gout    HLD (hyperlipidemia)    Hyperparathyroidism due to renal insufficiency (Donald Alvarez)    Hypertension    Renal disorder    Patient Active Problem List   Diagnosis Date Noted   Unspecified severe protein-calorie malnutrition (Donald Alvarez) 12/06/2021   Anaphylactic shock, unspecified, initial encounter 10/09/2021   Low testosterone 06/08/2021   Other male erectile dysfunction 06/08/2021   Polycystic kidney disease 06/08/2021   Vitamin B12 deficiency 06/08/2021   Vitamin D deficiency 06/08/2021   Chest pain, atypical 03/14/2021   Contrast media allergy 03/14/2021   Coronary artery disease involving native coronary artery of native heart with angina pectoris ([place]) 03/14/2021   Dependence on hemodialysis (Donald Alvarez) 03/13/2021   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Donald Alvarez) 03/13/2021   Kidney transplant candidate 03/13/2021   Encounter for screening for respiratory tuberculosis 02/06/2021   Diarrhea, unspecified 01/25/2021   Abnormal CT scan, chest 01/24/2021   Encounter for removal of sutures 08/21/2020   Hammertoes of both feet 03/01/2020   Numbness and tingling of both feet 03/01/2020   Onychomycosis due to dermatophyte 03/01/2020   Allergy, unspecified, initial encounter 01/29/2020   Chronic cough 12/11/2018    HAP (hospital-acquired pneumonia) 07/20/2018   Left leg numbness 05/19/2018   LVH (left ventricular hypertrophy) 05/19/2018   Hyperkalemia 09/17/2017   Disorder of phosphorus metabolism, unspecified 06/17/2017   Anemia in chronic kidney disease 06/16/2017   Dyspnea, unspecified 06/16/2017   Iron deficiency anemia 06/16/2017   Pain, unspecified 06/16/2017   Pruritus, unspecified 06/16/2017   Secondary hyperparathyroidism of renal origin ([place]) 06/16/2017   Gout 07/09/2016   HLD (hyperlipidemia) 07/09/2016   ESRD on dialysis Donald Alvarez)    Chronic idiopathic gout involving toe of left foot without tophus 10/11/2014   Hypertension secondary to other renal disorders 10/11/2014   Bilateral edema of lower extremity 05/20/2014   HTN (hypertension) 05/20/2014   Diabetes mellitus without complication (Donald Alvarez) 02/58/5277   Polycystic kidney disease, autosomal dominant 05/20/2014   Gout flare 05/20/2014   Neuropathy in diabetes (Donald Alvarez) 11/23/2013   Plantar fasciitis of left foot 11/23/2013   Callus of foot 11/23/2013   Porokeratosis 11/23/2013   Past Surgical History:  Procedure Laterality Date   AV FISTULA PLACEMENT Left 07/17/2015   Procedure: Left Upper Arm  ARTERIOVENOUS FISTULA CREATION;  Surgeon: Donald Posner, MD;  Location: [name];  Service: Vascular;  Laterality: Left;   FISTULA PLUG     NO PAST SURGERIES     Current Outpatient Medications on File Prior to Visit  Medication Sig Dispense Refill   allopurinol (ZYLOPRIM) 100 MG tablet Take 1 tablet (100 mg total) by mouth daily. 90 tablet 1   amLODipine (NORVASC) 10 MG tablet Take 10 mg by mouth daily.     aspirin 81 MG tablet Take 81 mg by mouth daily.  atorvastatin (LIPITOR) 40 MG tablet Take 1 tablet by mouth daily.     B Complex-C-Folic Acid (DIALYVITE 706) 0.8 MG TABS Take 1 tablet by mouth daily as needed.     carvedilol (COREG) 25 MG tablet Take 25 mg by mouth 2 (two) times daily with a meal.     cinacalcet (SENSIPAR) 30 MG tablet  Take 30 mg by mouth daily.     ferric citrate (AURYXIA) 1 GM 210 MG(Fe) tablet Take 210 mg by mouth with breakfast, with lunch, and with evening meal.     hydrALAZINE (APRESOLINE) 100 MG tablet Take 100 mg by mouth 2 (two) times daily.     Insulin Pen Needle (PEN NEEDLES) 31G X 5 MM MISC Use to injection insulin twice daily. 50 each PRN   lanthanum (FOSRENOL) 1000 MG chewable tablet Chew 1,000 mg by mouth in the morning, at noon, and at bedtime.     lidocaine-prilocaine (EMLA) cream Apply 1 application topically See admin instructions. With dialysis Tuesday,thursday,saturday     LOKELMA 10 g PACK packet Take 1 packet by mouth daily.     NOVOLOG MIX 70/30 (70-30) 100 UNIT/ML injection Inject 10 Units into the skin 2 (two) times daily as needed.     omeprazole (PRILOSEC) 40 MG capsule Take 1 capsule (40 mg total) by mouth daily. 1 tablet before dinner daily 30 capsule 2   Respiratory Therapy Supplies (FLUTTER) DEVI 1 Device by Does not apply route as needed. 1 each 0   traZODone (DESYREL) 100 MG tablet Take 100 mg by mouth at bedtime as needed for sleep.     triamcinolone ointment (KENALOG) 0.1 % Apply 1 application topically 2 (two) times daily. To affected areas 60 g 6   No current facility-administered medications on file prior to visit.    Allergies  Allergen Reactions   Iodinated Contrast Media Hives and Anaphylaxis   Lisinopril Hives    Other reaction(s): Cough, Other (See Comments) cough    Other Other (See Comments)    Itching and rash from "ultrasound gel"   Ultrasound Gel Itching and Rash   Social History   Occupational History   Not on file  Tobacco Use   Smoking status: Never   Smokeless tobacco: Never  Vaping Use   Vaping Use: Never used  Substance and Sexual Activity   Alcohol use: Never   Drug use: Never   Sexual activity: Yes    Birth control/protection: None   Family History  Problem Relation Age of Onset   Diabetes Mellitus II Mother    Kidney disease  Mother    Diabetes Mother    Hypertension Mother    CAD Father    Heart disease Father    Hypertension Father    Immunization History  Administered Date(s) Administered   Influenza Split 08/09/2014   PFIZER(Purple Top)SARS-COV-2 Vaccination 02/16/2020, 03/16/2020   Pneumococcal Polysaccharide-23 07/01/2013   Tdap 05/18/2014     Review of Systems: Negative except as noted in the HPI.   Objective: There were no vitals filed for this visit.  Derelle Kapfer is a pleasant 56 y.o. male in NAD. AAO X 3.  Vascular Examination: CFT <3 seconds b/l LE. Palpable DP pulse(s) b/l LE. Palpable PT pulse(s) b/l LE. Pedal hair absent. No pain with calf compression b/l. Lower extremity skin temperature gradient within normal limits. No edema noted b/l LE. No ischemia or gangrene noted b/l LE. No cyanosis or clubbing noted b/l LE.  Dermatological Examination: Pedal skin thin and atrophic  b/l LE. No open wounds b/l LE. No interdigital macerations noted b/l LE. Toenails 1-5 b/l elongated, discolored, dystrophic, thickened, crumbly with subungual debris and tenderness to dorsal palpation. Hyperkeratotic lesion(s) L hallux, submet head 1 b/l, submet head 3 b/l, and submet head 4 b/l.  No erythema, no edema, no drainage, no fluctuance.  Musculoskeletal Examination: Muscle strength 5/5 to all lower extremity muscle groups bilaterally. Hammertoe deformity noted 2-5 b/l. Pes planus deformity noted bilateral LE. Patient ambulates independent of any assistive aids.  Footwear Assessment: Does the patient wear appropriate shoes? Yes. Does the patient need inserts/orthotics? Yes.  Neurological Examination: Pt has subjective symptoms of neuropathy. Protective sensation diminished with 10g monofilament b/l. Vibratory sensation intact b/l. Proprioception intact bilaterally.  Assessment: 1. Onychomycosis   2. Callus   3. Hammertoes of both feet   4. DM type 2 with diabetic peripheral neuropathy ([place])   5.  Diabetes mellitus due to underlying condition with chronic kidney disease on chronic dialysis, with long-term current use of insulin ([place])   6. Encounter for diabetic foot exam ([place])     ADA Risk Categorization: High Risk  Patient has one or more of the following: Loss of protective sensation Absent pedal pulses Severe Foot deformity History of foot ulcer  Plan: -Examined patient. -Diabetic foot examination performed today. -Continue foot and shoe inspections daily. Monitor blood glucose per PCP/Endocrinologist's recommendations. -Toenails 1-5 b/l were debrided in length and girth with sterile nail nippers and dremel without iatrogenic bleeding.  -Callus(es) L hallux, submet head 1 b/l, submet head 3 b/l, and submet head 4 b/l pared utilizing sterile scalpel blade without complication or incident. Total number debrided =7. -Patient/POA to call should there be question/concern in the interim.  Return in about 3 months (around 04/03/2022).  Marzetta Board, DPM

## 2022-01-11 DIAGNOSIS — H02834 Dermatochalasis of left upper eyelid: Secondary | ICD-10-CM | POA: Diagnosis not present

## 2022-01-11 DIAGNOSIS — H40003 Preglaucoma, unspecified, bilateral: Secondary | ICD-10-CM | POA: Diagnosis not present

## 2022-01-11 DIAGNOSIS — H527 Unspecified disorder of refraction: Secondary | ICD-10-CM | POA: Diagnosis not present

## 2022-01-11 DIAGNOSIS — H02831 Dermatochalasis of right upper eyelid: Secondary | ICD-10-CM | POA: Diagnosis not present

## 2022-01-11 DIAGNOSIS — H25813 Combined forms of age-related cataract, bilateral: Secondary | ICD-10-CM | POA: Diagnosis not present

## 2022-01-11 DIAGNOSIS — E113291 Type 2 diabetes mellitus with mild nonproliferative diabetic retinopathy without macular edema, right eye: Secondary | ICD-10-CM | POA: Diagnosis not present

## 2022-01-12 DIAGNOSIS — N2581 Secondary hyperparathyroidism of renal origin: Secondary | ICD-10-CM | POA: Diagnosis not present

## 2022-01-12 DIAGNOSIS — N186 End stage renal disease: Secondary | ICD-10-CM | POA: Diagnosis not present

## 2022-01-12 DIAGNOSIS — Z992 Dependence on renal dialysis: Secondary | ICD-10-CM | POA: Diagnosis not present

## 2022-01-14 DIAGNOSIS — H52203 Unspecified astigmatism, bilateral: Secondary | ICD-10-CM | POA: Diagnosis not present

## 2022-01-14 DIAGNOSIS — H02834 Dermatochalasis of left upper eyelid: Secondary | ICD-10-CM | POA: Diagnosis not present

## 2022-01-14 DIAGNOSIS — H25813 Combined forms of age-related cataract, bilateral: Secondary | ICD-10-CM | POA: Diagnosis not present

## 2022-01-14 DIAGNOSIS — E113292 Type 2 diabetes mellitus with mild nonproliferative diabetic retinopathy without macular edema, left eye: Secondary | ICD-10-CM | POA: Diagnosis not present

## 2022-01-14 DIAGNOSIS — H02831 Dermatochalasis of right upper eyelid: Secondary | ICD-10-CM | POA: Diagnosis not present

## 2022-01-14 DIAGNOSIS — H40003 Preglaucoma, unspecified, bilateral: Secondary | ICD-10-CM | POA: Diagnosis not present

## 2022-01-14 DIAGNOSIS — H527 Unspecified disorder of refraction: Secondary | ICD-10-CM | POA: Diagnosis not present

## 2022-01-14 DIAGNOSIS — E113291 Type 2 diabetes mellitus with mild nonproliferative diabetic retinopathy without macular edema, right eye: Secondary | ICD-10-CM | POA: Diagnosis not present

## 2022-01-14 DIAGNOSIS — E113293 Type 2 diabetes mellitus with mild nonproliferative diabetic retinopathy without macular edema, bilateral: Secondary | ICD-10-CM | POA: Diagnosis not present

## 2022-01-14 LAB — HM DIABETES EYE EXAM

## 2022-01-15 DIAGNOSIS — Z992 Dependence on renal dialysis: Secondary | ICD-10-CM | POA: Diagnosis not present

## 2022-01-15 DIAGNOSIS — N2581 Secondary hyperparathyroidism of renal origin: Secondary | ICD-10-CM | POA: Diagnosis not present

## 2022-01-15 DIAGNOSIS — E1122 Type 2 diabetes mellitus with diabetic chronic kidney disease: Secondary | ICD-10-CM | POA: Diagnosis not present

## 2022-01-15 DIAGNOSIS — N186 End stage renal disease: Secondary | ICD-10-CM | POA: Diagnosis not present

## 2022-01-17 DIAGNOSIS — Z992 Dependence on renal dialysis: Secondary | ICD-10-CM | POA: Diagnosis not present

## 2022-01-17 DIAGNOSIS — N2581 Secondary hyperparathyroidism of renal origin: Secondary | ICD-10-CM | POA: Diagnosis not present

## 2022-01-17 DIAGNOSIS — N186 End stage renal disease: Secondary | ICD-10-CM | POA: Diagnosis not present

## 2022-01-19 DIAGNOSIS — N2581 Secondary hyperparathyroidism of renal origin: Secondary | ICD-10-CM | POA: Diagnosis not present

## 2022-01-19 DIAGNOSIS — Z992 Dependence on renal dialysis: Secondary | ICD-10-CM | POA: Diagnosis not present

## 2022-01-19 DIAGNOSIS — N186 End stage renal disease: Secondary | ICD-10-CM | POA: Diagnosis not present

## 2022-01-21 ENCOUNTER — Other Ambulatory Visit: Payer: Self-pay

## 2022-01-21 ENCOUNTER — Ambulatory Visit (INDEPENDENT_AMBULATORY_CARE_PROVIDER_SITE_OTHER): Payer: Medicare HMO

## 2022-01-21 DIAGNOSIS — M2041 Other hammer toe(s) (acquired), right foot: Secondary | ICD-10-CM | POA: Diagnosis not present

## 2022-01-21 DIAGNOSIS — L84 Corns and callosities: Secondary | ICD-10-CM

## 2022-01-21 DIAGNOSIS — M2042 Other hammer toe(s) (acquired), left foot: Secondary | ICD-10-CM | POA: Diagnosis not present

## 2022-01-21 DIAGNOSIS — E1142 Type 2 diabetes mellitus with diabetic polyneuropathy: Secondary | ICD-10-CM | POA: Diagnosis not present

## 2022-01-21 DIAGNOSIS — E1040 Type 1 diabetes mellitus with diabetic neuropathy, unspecified: Secondary | ICD-10-CM | POA: Diagnosis not present

## 2022-01-21 NOTE — Progress Notes (Signed)
SITUATION ?Reason for Visit: Fitting of Diabetic [place] ?Patient / Caregiver Report:  Patient is satisfied with fit and function of shoes and insoles. ? ?OBJECTIVE DATA: ?Patient History / Diagnosis:   ?  ICD-10-CM   ?1. DM type 2 with diabetic peripheral neuropathy (HCC)  E11.42   ?  ?2. Hammertoes of both feet  M20.41   ? M20.42   ?  ?3. Callus  L84   ?  ? ? ?Change in Status:   None ? ?ACTIONS PERFORMED: ?In-Person Delivery, patient was fit with: ?- 1x pair [id number] PDAC approved prefabricated Diabetic Shoes: [employer] dress shoes ?- 3x pair [id number] PDAC approved vacuum formed custom diabetic insoles; [place]: [id number] ? ?Shoes and insoles were verified for structural integrity and safety. Patient wore shoes and insoles in office. Skin was inspected and free of areas of concern after wearing shoes and inserts. Shoes and inserts fit properly. Patient / Caregiver provided with ferbal instruction and demonstration regarding donning, doffing, wear, care, proper fit, function, purpose, cleaning, and use of shoes and insoles ' and in all related precautions and risks and benefits regarding shoes and insoles. Patient / Caregiver was instructed to wear properly fitting socks with shoes at all times. Patient was also provided with verbal instruction regarding how to report any failures or malfunctions of shoes or inserts, and necessary follow up care. Patient / Caregiver was also instructed to contact physician regarding change in status that may affect function of shoes and inserts.  ? ?Patient / Caregiver verbalized undersatnding of instruction provided. Patient / Caregiver demonstrated independence with proper donning and doffing of shoes and inserts. ? ?PLAN ?Patient to follow up as needed. Plan of care was discussed with and agreed upon by patient and/or caregiver. All questions were answered and concerns addressed. ? ?

## 2022-01-22 DIAGNOSIS — N186 End stage renal disease: Secondary | ICD-10-CM | POA: Diagnosis not present

## 2022-01-22 DIAGNOSIS — Z992 Dependence on renal dialysis: Secondary | ICD-10-CM | POA: Diagnosis not present

## 2022-01-22 DIAGNOSIS — N2581 Secondary hyperparathyroidism of renal origin: Secondary | ICD-10-CM | POA: Diagnosis not present

## 2022-01-24 DIAGNOSIS — N186 End stage renal disease: Secondary | ICD-10-CM | POA: Diagnosis not present

## 2022-01-24 DIAGNOSIS — N2581 Secondary hyperparathyroidism of renal origin: Secondary | ICD-10-CM | POA: Diagnosis not present

## 2022-01-24 DIAGNOSIS — Z992 Dependence on renal dialysis: Secondary | ICD-10-CM | POA: Diagnosis not present

## 2022-01-25 DIAGNOSIS — R69 Illness, unspecified: Secondary | ICD-10-CM | POA: Diagnosis not present

## 2022-01-26 DIAGNOSIS — Z992 Dependence on renal dialysis: Secondary | ICD-10-CM | POA: Diagnosis not present

## 2022-01-26 DIAGNOSIS — N2581 Secondary hyperparathyroidism of renal origin: Secondary | ICD-10-CM | POA: Diagnosis not present

## 2022-01-26 DIAGNOSIS — N186 End stage renal disease: Secondary | ICD-10-CM | POA: Diagnosis not present

## 2022-01-29 DIAGNOSIS — N186 End stage renal disease: Secondary | ICD-10-CM | POA: Diagnosis not present

## 2022-01-29 DIAGNOSIS — Z992 Dependence on renal dialysis: Secondary | ICD-10-CM | POA: Diagnosis not present

## 2022-01-29 DIAGNOSIS — N2581 Secondary hyperparathyroidism of renal origin: Secondary | ICD-10-CM | POA: Diagnosis not present

## 2022-01-31 DIAGNOSIS — N186 End stage renal disease: Secondary | ICD-10-CM | POA: Diagnosis not present

## 2022-01-31 DIAGNOSIS — Z992 Dependence on renal dialysis: Secondary | ICD-10-CM | POA: Diagnosis not present

## 2022-01-31 DIAGNOSIS — N2581 Secondary hyperparathyroidism of renal origin: Secondary | ICD-10-CM | POA: Diagnosis not present

## 2022-02-02 DIAGNOSIS — N186 End stage renal disease: Secondary | ICD-10-CM | POA: Diagnosis not present

## 2022-02-02 DIAGNOSIS — Z992 Dependence on renal dialysis: Secondary | ICD-10-CM | POA: Diagnosis not present

## 2022-02-02 DIAGNOSIS — N2581 Secondary hyperparathyroidism of renal origin: Secondary | ICD-10-CM | POA: Diagnosis not present

## 2022-02-05 DIAGNOSIS — N186 End stage renal disease: Secondary | ICD-10-CM | POA: Diagnosis not present

## 2022-02-05 DIAGNOSIS — Z992 Dependence on renal dialysis: Secondary | ICD-10-CM | POA: Diagnosis not present

## 2022-02-05 DIAGNOSIS — N2581 Secondary hyperparathyroidism of renal origin: Secondary | ICD-10-CM | POA: Diagnosis not present

## 2022-02-07 DIAGNOSIS — N2581 Secondary hyperparathyroidism of renal origin: Secondary | ICD-10-CM | POA: Diagnosis not present

## 2022-02-07 DIAGNOSIS — N186 End stage renal disease: Secondary | ICD-10-CM | POA: Diagnosis not present

## 2022-02-07 DIAGNOSIS — Z992 Dependence on renal dialysis: Secondary | ICD-10-CM | POA: Diagnosis not present

## 2022-02-09 DIAGNOSIS — N186 End stage renal disease: Secondary | ICD-10-CM | POA: Diagnosis not present

## 2022-02-09 DIAGNOSIS — N2581 Secondary hyperparathyroidism of renal origin: Secondary | ICD-10-CM | POA: Diagnosis not present

## 2022-02-09 DIAGNOSIS — Z992 Dependence on renal dialysis: Secondary | ICD-10-CM | POA: Diagnosis not present

## 2022-02-12 DIAGNOSIS — Z992 Dependence on renal dialysis: Secondary | ICD-10-CM | POA: Diagnosis not present

## 2022-02-12 DIAGNOSIS — N2581 Secondary hyperparathyroidism of renal origin: Secondary | ICD-10-CM | POA: Diagnosis not present

## 2022-02-12 DIAGNOSIS — N186 End stage renal disease: Secondary | ICD-10-CM | POA: Diagnosis not present

## 2022-02-14 DIAGNOSIS — N186 End stage renal disease: Secondary | ICD-10-CM | POA: Diagnosis not present

## 2022-02-14 DIAGNOSIS — Z992 Dependence on renal dialysis: Secondary | ICD-10-CM | POA: Diagnosis not present

## 2022-02-14 DIAGNOSIS — N2581 Secondary hyperparathyroidism of renal origin: Secondary | ICD-10-CM | POA: Diagnosis not present

## 2022-02-15 DIAGNOSIS — E1122 Type 2 diabetes mellitus with diabetic chronic kidney disease: Secondary | ICD-10-CM | POA: Diagnosis not present

## 2022-02-15 DIAGNOSIS — Z992 Dependence on renal dialysis: Secondary | ICD-10-CM | POA: Diagnosis not present

## 2022-02-15 DIAGNOSIS — N186 End stage renal disease: Secondary | ICD-10-CM | POA: Diagnosis not present

## 2022-02-16 DIAGNOSIS — N186 End stage renal disease: Secondary | ICD-10-CM | POA: Diagnosis not present

## 2022-02-16 DIAGNOSIS — Z992 Dependence on renal dialysis: Secondary | ICD-10-CM | POA: Diagnosis not present

## 2022-02-16 DIAGNOSIS — N2581 Secondary hyperparathyroidism of renal origin: Secondary | ICD-10-CM | POA: Diagnosis not present

## 2022-02-17 ENCOUNTER — Other Ambulatory Visit: Payer: Self-pay

## 2022-02-17 ENCOUNTER — Encounter: Payer: Self-pay | Admitting: Emergency Medicine

## 2022-02-17 ENCOUNTER — Emergency Department (INDEPENDENT_AMBULATORY_CARE_PROVIDER_SITE_OTHER)
Admission: EM | Admit: 2022-02-17 | Discharge: 2022-02-17 | Disposition: A | Discharge status: Home / Self Care | Payer: Medicare HMO | Source: Home / Self Care | Attending: Family Medicine | Admitting: Family Medicine

## 2022-02-17 DIAGNOSIS — Z992 Dependence on renal dialysis: Secondary | ICD-10-CM | POA: Diagnosis not present

## 2022-02-17 DIAGNOSIS — E118 Type 2 diabetes mellitus with unspecified complications: Secondary | ICD-10-CM | POA: Insufficient documentation

## 2022-02-17 DIAGNOSIS — R319 Hematuria, unspecified: Secondary | ICD-10-CM | POA: Diagnosis not present

## 2022-02-17 DIAGNOSIS — N186 End stage renal disease: Secondary | ICD-10-CM | POA: Diagnosis not present

## 2022-02-17 LAB — POCT URINALYSIS DIP (MANUAL ENTRY)
Glucose, UA: NEGATIVE mg/dL
Nitrite, UA: POSITIVE — AB
Protein Ur, POC: >=300 mg/dL — AB
Spec Grav, UA: 1.01 (ref 1.010–1.025)
Urobilinogen, UA: 4 E.U./dL — AB
pH, UA: >=9 — AB (ref 5.0–8.0)

## 2022-02-17 MED ORDER — CIPROFLOXACIN HCL 500 MG PO TABS: 500.0000 mg | ORAL_TABLET | Freq: Every day | ORAL | 0 refills | Status: DC

## 2022-02-17 NOTE — ED Triage Notes (Signed)
Hx of hematuria  ?Usually resolves - ongoing 4 days this time  ?Pt is on hemodialysis ?Tues/thurs/Sat  schedule  ?Here with wife  ? ?

## 2022-02-17 NOTE — Discharge Instructions (Addendum)
The urine culture will be available in 2-3 days ?Take the cipro antibiotic once a day ?Call your nephrologise tomorrow for advice regarding bleeding and medication use ?

## 2022-02-17 NOTE — ED Provider Notes (Signed)
?[facility] ? ? ? ?CSN: [id number] ?Arrival date & time: 02/17/22  1458 ? ? ?  ? ?History   ?Chief Complaint ?Chief Complaint  ?Patient presents with  ? Hematuria  ? ? ?HPI ?Donald Alvarez is a 56 y.o. male.  ? ?HPI ? ?Very pleasant gentleman who is here for hematuria.  He has polycystic kidneys and states he periodically will have hematuria because a cyst will rupture.  This usually is temporary.  His current episode of hematuria has been lasting for 4 days.  He is concerned because his urine looks like a blood sample.  He does produce very little urine because he is on dialysis for end-stage renal disease.  Patient is an insulin-dependent diabetic with hyperlipidemia and hypertension.  He also has a history of anemia of chronic disease ? ?Past Medical History:  ?Diagnosis Date  ? Anemia   ? Complication of anesthesia   ? Pt stated that his blood preesure must be monitored closely during any surgical procedure ( per neurologist)  ? Diabetes mellitus without complication ([place])   ? ESRD on dialysis [facility])   ? Gout   ? HLD (hyperlipidemia)   ? Hyperparathyroidism due to renal insufficiency ([place])   ? Hypertension   ? Renal disorder   ? ? ?Patient Active Problem List  ? Diagnosis Date Noted  ? Type 2 diabetes mellitus with complication ([place]) 50/35/4656  ? Unspecified severe protein-calorie malnutrition ([place]) 12/06/2021  ? Anaphylactic shock, unspecified, initial encounter 10/09/2021  ? Low testosterone 06/08/2021  ? Other male erectile dysfunction 06/08/2021  ? Vitamin B12 deficiency 06/08/2021  ? Vitamin D deficiency 06/08/2021  ? Chest pain, atypical 03/14/2021  ? Contrast media allergy 03/14/2021  ? Coronary artery disease involving native coronary artery of native heart with angina pectoris ([place]) 03/14/2021  ? 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[place]) 03/13/2021  ? Kidney transplant candidate 03/13/2021  ? Diarrhea, unspecified 01/25/2021  ? Abnormal CT scan, chest 01/24/2021  ? Hammertoes of both feet 03/01/2020  ?  Numbness and tingling of both feet 03/01/2020  ? Onychomycosis due to dermatophyte 03/01/2020  ? Chronic cough 12/11/2018  ? HAP (hospital-acquired pneumonia) 07/20/2018  ? Left leg numbness 05/19/2018  ? LVH (left ventricular hypertrophy) 05/19/2018  ? Hyperkalemia 09/17/2017  ? Disorder of phosphorus metabolism, unspecified 06/17/2017  ? Anemia in chronic kidney disease 06/16/2017  ? Dyspnea, unspecified 06/16/2017  ? Pruritus, unspecified 06/16/2017  ? Secondary hyperparathyroidism of renal origin ([place]) 06/16/2017  ? Gout 07/09/2016  ? HLD (hyperlipidemia) 07/09/2016  ? ESRD on dialysis [facility])   ? Chronic idiopathic gout involving toe of left foot without tophus 10/11/2014  ? Hypertension secondary to other renal disorders 10/11/2014  ? Bilateral edema of lower extremity 05/20/2014  ? HTN (hypertension) 05/20/2014  ? Polycystic kidney disease, autosomal dominant 05/20/2014  ? Gout flare 05/20/2014  ? Neuropathy in diabetes ([place]) 11/23/2013  ? Plantar fasciitis of left foot 11/23/2013  ? Callus of foot 11/23/2013  ? Porokeratosis 11/23/2013  ? ? ?Past Surgical History:  ?Procedure Laterality Date  ? AV FISTULA PLACEMENT Left 07/17/2015  ? Procedure: Left Upper Arm  ARTERIOVENOUS FISTULA CREATION;  Surgeon: [name], MD;  Location: [facility];  Service: Vascular;  Laterality: Left;  ? FISTULA PLUG    ? NO PAST SURGERIES    ? ? ? ? ? ?Home Medications   ? ?Prior to Admission medications   ?Medication Sig Start Date End Date Taking? Authorizing Provider  ?ciprofloxacin (CIPRO) 500 MG tablet Take 1 tablet (500  mg total) by mouth daily with breakfast. 02/17/22  Yes Raylene Everts, MD  ?sucroferric oxyhydroxide (VELPHORO) 500 MG chewable tablet Chew by mouth. 01/05/22  Yes [provider]  ?allopurinol (ZYLOPRIM) 100 MG tablet Take 1 tablet (100 mg total) by mouth daily. 05/18/21   [name], NP  ?amLODipine (NORVASC) 10 MG tablet Take 10 mg by mouth daily.    [provider]  ?aspirin 81 MG tablet Take  81 mg by mouth daily.    [provider]  ?atorvastatin (LIPITOR) 40 MG tablet Take 1 tablet by mouth daily. 03/14/21   [provider]  ?B Complex-C-Folic Acid (DIALYVITE 660) 0.8 MG TABS Take 1 tablet by mouth daily as needed. 11/16/18   [provider]  ?carvedilol (COREG) 25 MG tablet Take 25 mg by mouth 2 (two) times daily with a meal.    [provider]  ?cinacalcet (SENSIPAR) 30 MG tablet Take 30 mg by mouth daily. 12/21/20   [provider]  ?ferric citrate (AURYXIA) 1 GM 210 MG(Fe) tablet Take 210 mg by mouth with breakfast, with lunch, and with evening meal. 06/20/20   [provider]  ?hydrALAZINE (APRESOLINE) 100 MG tablet Take 100 mg by mouth 2 (two) times daily. 07/24/18   [provider]  ?Insulin Pen Needle (PEN NEEDLES) 31G X 5 MM MISC Use to injection insulin twice daily. 07/13/21   [name], NP  ?lanthanum (FOSRENOL) 1000 MG chewable tablet Chew 1,000 mg by mouth in the morning, at noon, and at bedtime. 04/06/20   [provider]  ?lidocaine-prilocaine (EMLA) cream Apply 1 application topically See admin instructions. With dialysis Tuesday,thursday,saturday 11/23/20   [provider]  ?LOKELMA 10 g PACK packet Take 1 packet by mouth daily. 12/05/20   [provider]  ?nateglinide (STARLIX) 60 MG tablet Take by mouth.    [provider]  ?NOVOLOG MIX 70/30 (70-30) 100 UNIT/ML injection Inject 10 Units into the skin 2 (two) times daily as needed. 09/20/20   [provider]  ?omeprazole (PRILOSEC) 40 MG capsule Take 1 capsule (40 mg total) by mouth daily. 1 tablet before dinner daily 06/11/21   Rozetta Nunnery, MD  ?Respiratory Therapy Supplies (FLUTTER) DEVI 1 Device by Does not apply route as needed. 09/16/19   Lauraine Rinne, NP  ?traZODone (DESYREL) 100 MG tablet Take 100 mg by mouth at bedtime as needed for sleep. 10/29/20   [provider]  ?triamcinolone ointment (KENALOG) 0.1 % Apply  1 application topically 2 (two) times daily. To affected areas ?Patient not taking: Reported on 02/17/2022 05/18/21   [name], NP  ? ? ?Family History ?Family History  ?Problem Relation Age of Onset  ? Diabetes Mellitus II Mother   ? Kidney disease Mother   ? Diabetes Mother   ? Hypertension Mother   ? CAD Father   ? Heart disease Father   ? Hypertension Father   ? ? ?Social History ?Social History  ? ?Tobacco Use  ? Smoking status: Never  ? Smokeless tobacco: Never  ?Vaping Use  ? Vaping Use: Never used  ?Substance Use Topics  ? Alcohol use: Never  ? Drug use: Never  ? ? ? ?Allergies   ?Iodinated contrast media, Lisinopril, Other, and Ultrasound gel ? ? ?Review of Systems ?Review of Systems ? ?See HPI ?Physical Exam ?Triage Vital Signs ?ED Triage Vitals  ?Enc Vitals Group  ?   BP 02/17/22 1527 (!) 170/88  ?   Pulse Rate 02/17/22  1527 77  ?   Resp 02/17/22 1527 17  ?   Temp 02/17/22 1527 99.5 ?F (37.5 ?C)  ?   Temp Source 02/17/22 1527 Oral  ?   SpO2 02/17/22 1527 97 %  ?   Weight 02/17/22 1528 164 lb 3.9 oz (74.5 kg)  ?   Height 02/17/22 1528 6\' 1"  (1.854 m)  ?   Head Circumference --   ?   Peak Flow --   ?   Pain Score 02/17/22 1528 1  ?   Pain Loc --   ?   Pain Edu? --   ?   Excl. in [place]? --   ? ?No data found. ? ?Updated Vital Signs ?BP (!) 170/88 (BP Location: Right Arm)   Pulse 77   Temp 99.5 ?F (37.5 ?C) (Oral)   Resp 17   Ht 6\' 1"  (1.854 m)   Wt 74.5 kg   SpO2 97%   BMI 21.67 kg/m?  ?    ? ?Physical Exam ?Constitutional:   ?   General: He is not in acute distress. ?   Appearance: Normal appearance. He is well-developed and normal weight.  ?HENT:  ?   Head: Normocephalic and atraumatic.  ?   Mouth/Throat:  ?   Comments: Mask is in place ?Eyes:  ?   Conjunctiva/sclera: Conjunctivae normal.  ?   Pupils: Pupils are equal, round, and reactive to light.  ?Cardiovascular:  ?   Rate and Rhythm: Normal rate and regular rhythm.  ?   Heart sounds: Normal heart sounds.  ?Pulmonary:  ?   Effort: Pulmonary effort is  normal. No respiratory distress.  ?   Breath sounds: Normal breath sounds.  ?Abdominal:  ?   General: There is no distension.  ?   Palpations: Abdomen is soft.  ?   Tenderness: There is no abdominal tendernes

## 2022-02-18 LAB — URINE CULTURE
MICRO NUMBER:: 13213020
SPECIMEN QUALITY:: ADEQUATE

## 2022-02-19 DIAGNOSIS — N2581 Secondary hyperparathyroidism of renal origin: Secondary | ICD-10-CM | POA: Diagnosis not present

## 2022-02-19 DIAGNOSIS — N186 End stage renal disease: Secondary | ICD-10-CM | POA: Diagnosis not present

## 2022-02-19 DIAGNOSIS — Z992 Dependence on renal dialysis: Secondary | ICD-10-CM | POA: Diagnosis not present

## 2022-02-21 DIAGNOSIS — N186 End stage renal disease: Secondary | ICD-10-CM | POA: Diagnosis not present

## 2022-02-21 DIAGNOSIS — Z992 Dependence on renal dialysis: Secondary | ICD-10-CM | POA: Diagnosis not present

## 2022-02-21 DIAGNOSIS — N2581 Secondary hyperparathyroidism of renal origin: Secondary | ICD-10-CM | POA: Diagnosis not present

## 2022-02-23 DIAGNOSIS — Z992 Dependence on renal dialysis: Secondary | ICD-10-CM | POA: Diagnosis not present

## 2022-02-23 DIAGNOSIS — N186 End stage renal disease: Secondary | ICD-10-CM | POA: Diagnosis not present

## 2022-02-23 DIAGNOSIS — N2581 Secondary hyperparathyroidism of renal origin: Secondary | ICD-10-CM | POA: Diagnosis not present

## 2022-02-26 DIAGNOSIS — N186 End stage renal disease: Secondary | ICD-10-CM | POA: Diagnosis not present

## 2022-02-26 DIAGNOSIS — N2581 Secondary hyperparathyroidism of renal origin: Secondary | ICD-10-CM | POA: Diagnosis not present

## 2022-02-26 DIAGNOSIS — Z992 Dependence on renal dialysis: Secondary | ICD-10-CM | POA: Diagnosis not present

## 2022-02-28 DIAGNOSIS — Z992 Dependence on renal dialysis: Secondary | ICD-10-CM | POA: Diagnosis not present

## 2022-02-28 DIAGNOSIS — N2581 Secondary hyperparathyroidism of renal origin: Secondary | ICD-10-CM | POA: Diagnosis not present

## 2022-02-28 DIAGNOSIS — N186 End stage renal disease: Secondary | ICD-10-CM | POA: Diagnosis not present

## 2022-03-02 DIAGNOSIS — Z992 Dependence on renal dialysis: Secondary | ICD-10-CM | POA: Diagnosis not present

## 2022-03-02 DIAGNOSIS — N186 End stage renal disease: Secondary | ICD-10-CM | POA: Diagnosis not present

## 2022-03-02 DIAGNOSIS — N2581 Secondary hyperparathyroidism of renal origin: Secondary | ICD-10-CM | POA: Diagnosis not present

## 2022-03-05 DIAGNOSIS — N186 End stage renal disease: Secondary | ICD-10-CM | POA: Diagnosis not present

## 2022-03-05 DIAGNOSIS — Z992 Dependence on renal dialysis: Secondary | ICD-10-CM | POA: Diagnosis not present

## 2022-03-05 DIAGNOSIS — N2581 Secondary hyperparathyroidism of renal origin: Secondary | ICD-10-CM | POA: Diagnosis not present

## 2022-03-07 DIAGNOSIS — N186 End stage renal disease: Secondary | ICD-10-CM | POA: Diagnosis not present

## 2022-03-07 DIAGNOSIS — Z992 Dependence on renal dialysis: Secondary | ICD-10-CM | POA: Diagnosis not present

## 2022-03-07 DIAGNOSIS — N2581 Secondary hyperparathyroidism of renal origin: Secondary | ICD-10-CM | POA: Diagnosis not present

## 2022-03-09 DIAGNOSIS — N2581 Secondary hyperparathyroidism of renal origin: Secondary | ICD-10-CM | POA: Diagnosis not present

## 2022-03-09 DIAGNOSIS — N186 End stage renal disease: Secondary | ICD-10-CM | POA: Diagnosis not present

## 2022-03-09 DIAGNOSIS — Z992 Dependence on renal dialysis: Secondary | ICD-10-CM | POA: Diagnosis not present

## 2022-03-11 DIAGNOSIS — T82858A Stenosis of vascular prosthetic devices, implants and grafts, initial encounter: Secondary | ICD-10-CM | POA: Diagnosis not present

## 2022-03-11 DIAGNOSIS — I871 Compression of vein: Secondary | ICD-10-CM | POA: Diagnosis not present

## 2022-03-11 DIAGNOSIS — N186 End stage renal disease: Secondary | ICD-10-CM | POA: Diagnosis not present

## 2022-03-11 DIAGNOSIS — Z992 Dependence on renal dialysis: Secondary | ICD-10-CM | POA: Diagnosis not present

## 2022-03-12 DIAGNOSIS — Z992 Dependence on renal dialysis: Secondary | ICD-10-CM | POA: Diagnosis not present

## 2022-03-12 DIAGNOSIS — N186 End stage renal disease: Secondary | ICD-10-CM | POA: Diagnosis not present

## 2022-03-12 DIAGNOSIS — N2581 Secondary hyperparathyroidism of renal origin: Secondary | ICD-10-CM | POA: Diagnosis not present

## 2022-03-14 DIAGNOSIS — N2581 Secondary hyperparathyroidism of renal origin: Secondary | ICD-10-CM | POA: Diagnosis not present

## 2022-03-14 DIAGNOSIS — N186 End stage renal disease: Secondary | ICD-10-CM | POA: Diagnosis not present

## 2022-03-14 DIAGNOSIS — Z992 Dependence on renal dialysis: Secondary | ICD-10-CM | POA: Diagnosis not present

## 2022-03-16 DIAGNOSIS — N2581 Secondary hyperparathyroidism of renal origin: Secondary | ICD-10-CM | POA: Diagnosis not present

## 2022-03-16 DIAGNOSIS — Z992 Dependence on renal dialysis: Secondary | ICD-10-CM | POA: Diagnosis not present

## 2022-03-16 DIAGNOSIS — N186 End stage renal disease: Secondary | ICD-10-CM | POA: Diagnosis not present

## 2022-03-17 DIAGNOSIS — E1122 Type 2 diabetes mellitus with diabetic chronic kidney disease: Secondary | ICD-10-CM | POA: Diagnosis not present

## 2022-03-17 DIAGNOSIS — Z992 Dependence on renal dialysis: Secondary | ICD-10-CM | POA: Diagnosis not present

## 2022-03-17 DIAGNOSIS — N186 End stage renal disease: Secondary | ICD-10-CM | POA: Diagnosis not present

## 2022-03-19 DIAGNOSIS — Z992 Dependence on renal dialysis: Secondary | ICD-10-CM | POA: Diagnosis not present

## 2022-03-19 DIAGNOSIS — N186 End stage renal disease: Secondary | ICD-10-CM | POA: Diagnosis not present

## 2022-03-19 DIAGNOSIS — N2581 Secondary hyperparathyroidism of renal origin: Secondary | ICD-10-CM | POA: Diagnosis not present

## 2022-03-21 DIAGNOSIS — N186 End stage renal disease: Secondary | ICD-10-CM | POA: Diagnosis not present

## 2022-03-21 DIAGNOSIS — N2581 Secondary hyperparathyroidism of renal origin: Secondary | ICD-10-CM | POA: Diagnosis not present

## 2022-03-21 DIAGNOSIS — Z992 Dependence on renal dialysis: Secondary | ICD-10-CM | POA: Diagnosis not present

## 2022-03-23 DIAGNOSIS — N186 End stage renal disease: Secondary | ICD-10-CM | POA: Diagnosis not present

## 2022-03-23 DIAGNOSIS — Z992 Dependence on renal dialysis: Secondary | ICD-10-CM | POA: Diagnosis not present

## 2022-03-23 DIAGNOSIS — N2581 Secondary hyperparathyroidism of renal origin: Secondary | ICD-10-CM | POA: Diagnosis not present

## 2022-03-26 DIAGNOSIS — Z992 Dependence on renal dialysis: Secondary | ICD-10-CM | POA: Diagnosis not present

## 2022-03-26 DIAGNOSIS — N186 End stage renal disease: Secondary | ICD-10-CM | POA: Diagnosis not present

## 2022-03-26 DIAGNOSIS — N2581 Secondary hyperparathyroidism of renal origin: Secondary | ICD-10-CM | POA: Diagnosis not present

## 2022-03-27 DIAGNOSIS — I671 Cerebral aneurysm, nonruptured: Secondary | ICD-10-CM | POA: Diagnosis not present

## 2022-03-27 DIAGNOSIS — Q612 Polycystic kidney, adult type: Secondary | ICD-10-CM | POA: Diagnosis not present

## 2022-03-27 DIAGNOSIS — N186 End stage renal disease: Secondary | ICD-10-CM | POA: Diagnosis not present

## 2022-03-27 DIAGNOSIS — Z01818 Encounter for other preprocedural examination: Secondary | ICD-10-CM | POA: Diagnosis not present

## 2022-03-28 DIAGNOSIS — N186 End stage renal disease: Secondary | ICD-10-CM | POA: Diagnosis not present

## 2022-03-28 DIAGNOSIS — Z992 Dependence on renal dialysis: Secondary | ICD-10-CM | POA: Diagnosis not present

## 2022-03-28 DIAGNOSIS — N2581 Secondary hyperparathyroidism of renal origin: Secondary | ICD-10-CM | POA: Diagnosis not present

## 2022-03-30 DIAGNOSIS — Z992 Dependence on renal dialysis: Secondary | ICD-10-CM | POA: Diagnosis not present

## 2022-03-30 DIAGNOSIS — N2581 Secondary hyperparathyroidism of renal origin: Secondary | ICD-10-CM | POA: Diagnosis not present

## 2022-03-30 DIAGNOSIS — N186 End stage renal disease: Secondary | ICD-10-CM | POA: Diagnosis not present

## 2022-04-02 DIAGNOSIS — Z992 Dependence on renal dialysis: Secondary | ICD-10-CM | POA: Diagnosis not present

## 2022-04-02 DIAGNOSIS — N186 End stage renal disease: Secondary | ICD-10-CM | POA: Diagnosis not present

## 2022-04-02 DIAGNOSIS — N2581 Secondary hyperparathyroidism of renal origin: Secondary | ICD-10-CM | POA: Diagnosis not present

## 2022-04-04 DIAGNOSIS — Z992 Dependence on renal dialysis: Secondary | ICD-10-CM | POA: Diagnosis not present

## 2022-04-04 DIAGNOSIS — N2581 Secondary hyperparathyroidism of renal origin: Secondary | ICD-10-CM | POA: Diagnosis not present

## 2022-04-04 DIAGNOSIS — N186 End stage renal disease: Secondary | ICD-10-CM | POA: Diagnosis not present

## 2022-04-05 ENCOUNTER — Encounter: Payer: Medicare HMO | Admitting: Medical-Surgical

## 2022-04-05 ENCOUNTER — Ambulatory Visit (INDEPENDENT_AMBULATORY_CARE_PROVIDER_SITE_OTHER): Payer: Medicare HMO | Admitting: Podiatry

## 2022-04-05 DIAGNOSIS — Z91199 Patient's noncompliance with other medical treatment and regimen due to unspecified reason: Secondary | ICD-10-CM

## 2022-04-05 NOTE — Progress Notes (Signed)
Erroneous encounter. Please disregard.

## 2022-04-05 NOTE — Progress Notes (Signed)
   Complete physical exam  Patient: Donald Alvarez   DOB: 09/07/2001   56 y.o. Male  MRN: [id number]  Subjective:    No chief complaint on file.   Donald Alvarez is a 56 y.o. male who presents today for a complete physical exam. She reports consuming a {diet types:17450} diet. {types:19826} She generally feels {DESC; WELL/FAIRLY WELL/POORLY:18703}. She reports sleeping {DESC; WELL/FAIRLY WELL/POORLY:18703}. She {does/does not:200015} have additional problems to discuss today.    Most recent fall risk assessment:    05/15/2022   10:42 AM  Fall Risk   Falls in the past year? 0  Number falls in past yr: 0  Injury with Fall? 0  Risk for fall due to : No Fall Risks  Follow up Falls evaluation completed     Most recent depression screenings:    05/15/2022   10:42 AM 04/05/2021   10:46 AM  PHQ 2/9 Scores  PHQ - 2 Score 0 0  PHQ- 9 Score 5     {VISON DENTAL STD PSA (Optional):27386}  {History (Optional):23778}  Patient Care Team: Jessup, Joy, NP as PCP - General (Nurse Practitioner)   Outpatient Medications Prior to Visit  Medication Sig   fluticasone (FLONASE) 50 MCG/ACT nasal spray Place 2 sprays into both nostrils in the morning and at bedtime. After 7 days, reduce to once daily.   norgestimate-ethinyl estradiol (SPRINTEC 28) 0.25-35 MG-MCG tablet Take 1 tablet by mouth daily.   Nystatin POWD Apply liberally to affected area 2 times per day   spironolactone (ALDACTONE) 100 MG tablet Take 1 tablet (100 mg total) by mouth daily.   No facility-administered medications prior to visit.    ROS        Objective:     There were no vitals taken for this visit. {Vitals History (Optional):23777}  Physical Exam   No results found for any visits on 06/20/22. {Show previous labs (optional):23779}    Assessment & Plan:    Routine Health Maintenance and Physical Exam  Immunization History  Administered Date(s) Administered   DTaP 11/21/1999, 01/17/2000,  03/27/2000, 12/11/2000, 06/26/2004   Hepatitis A 04/22/2008, 04/28/2009   Hepatitis B 09/08/1999, 10/16/1999, 03/27/2000   HiB (PRP-OMP) 11/21/1999, 01/17/2000, 03/27/2000, 12/11/2000   IPV 11/21/1999, 01/17/2000, 09/15/2000, 06/26/2004   Influenza,inj,Quad PF,6+ Mos 07/29/2014   Influenza-Unspecified 10/28/2012   MMR 09/15/2001, 06/26/2004   Meningococcal Polysaccharide 04/27/2012   Pneumococcal Conjugate-13 12/11/2000   Pneumococcal-Unspecified 03/27/2000, 06/10/2000   Tdap 04/27/2012   Varicella 09/15/2000, 04/22/2008    Health Maintenance  Topic Date Due   HIV Screening  Never done   Hepatitis C Screening  Never done   INFLUENZA VACCINE  06/18/2022   PAP-Cervical Cytology Screening  06/20/2022 (Originally 09/06/2020)   PAP SMEAR-Modifier  06/20/2022 (Originally 09/06/2020)   TETANUS/TDAP  06/20/2022 (Originally 04/27/2022)   HPV VACCINES  Discontinued   COVID-19 Vaccine  Discontinued    Discussed health benefits of physical activity, and encouraged her to engage in regular exercise appropriate for her age and condition.  Problem List Items Addressed This Visit   None Visit Diagnoses     Annual physical exam    -  Primary   Cervical cancer screening       Need for Tdap vaccination          No follow-ups on file.     Joy Jessup, NP   

## 2022-04-06 DIAGNOSIS — N2581 Secondary hyperparathyroidism of renal origin: Secondary | ICD-10-CM | POA: Diagnosis not present

## 2022-04-06 DIAGNOSIS — Z992 Dependence on renal dialysis: Secondary | ICD-10-CM | POA: Diagnosis not present

## 2022-04-06 DIAGNOSIS — N186 End stage renal disease: Secondary | ICD-10-CM | POA: Diagnosis not present

## 2022-04-09 DIAGNOSIS — Z992 Dependence on renal dialysis: Secondary | ICD-10-CM | POA: Diagnosis not present

## 2022-04-09 DIAGNOSIS — N186 End stage renal disease: Secondary | ICD-10-CM | POA: Diagnosis not present

## 2022-04-09 DIAGNOSIS — N2581 Secondary hyperparathyroidism of renal origin: Secondary | ICD-10-CM | POA: Diagnosis not present

## 2022-04-11 DIAGNOSIS — Z992 Dependence on renal dialysis: Secondary | ICD-10-CM | POA: Diagnosis not present

## 2022-04-11 DIAGNOSIS — N2581 Secondary hyperparathyroidism of renal origin: Secondary | ICD-10-CM | POA: Diagnosis not present

## 2022-04-11 DIAGNOSIS — N186 End stage renal disease: Secondary | ICD-10-CM | POA: Diagnosis not present

## 2022-04-13 DIAGNOSIS — Z992 Dependence on renal dialysis: Secondary | ICD-10-CM | POA: Diagnosis not present

## 2022-04-13 DIAGNOSIS — N186 End stage renal disease: Secondary | ICD-10-CM | POA: Diagnosis not present

## 2022-04-13 DIAGNOSIS — N2581 Secondary hyperparathyroidism of renal origin: Secondary | ICD-10-CM | POA: Diagnosis not present

## 2022-04-16 DIAGNOSIS — N186 End stage renal disease: Secondary | ICD-10-CM | POA: Diagnosis not present

## 2022-04-16 DIAGNOSIS — Z992 Dependence on renal dialysis: Secondary | ICD-10-CM | POA: Diagnosis not present

## 2022-04-16 DIAGNOSIS — N2581 Secondary hyperparathyroidism of renal origin: Secondary | ICD-10-CM | POA: Diagnosis not present

## 2022-04-17 DIAGNOSIS — E1122 Type 2 diabetes mellitus with diabetic chronic kidney disease: Secondary | ICD-10-CM | POA: Diagnosis not present

## 2022-04-17 DIAGNOSIS — Z992 Dependence on renal dialysis: Secondary | ICD-10-CM | POA: Diagnosis not present

## 2022-04-17 DIAGNOSIS — N186 End stage renal disease: Secondary | ICD-10-CM | POA: Diagnosis not present

## 2022-04-18 DIAGNOSIS — N2581 Secondary hyperparathyroidism of renal origin: Secondary | ICD-10-CM | POA: Diagnosis not present

## 2022-04-18 DIAGNOSIS — N186 End stage renal disease: Secondary | ICD-10-CM | POA: Diagnosis not present

## 2022-04-18 DIAGNOSIS — Z992 Dependence on renal dialysis: Secondary | ICD-10-CM | POA: Diagnosis not present

## 2022-04-19 ENCOUNTER — Ambulatory Visit (INDEPENDENT_AMBULATORY_CARE_PROVIDER_SITE_OTHER): Payer: Medicare HMO | Admitting: Podiatry

## 2022-04-19 ENCOUNTER — Encounter: Payer: Self-pay | Admitting: Podiatry

## 2022-04-19 DIAGNOSIS — N186 End stage renal disease: Secondary | ICD-10-CM

## 2022-04-19 DIAGNOSIS — L84 Corns and callosities: Secondary | ICD-10-CM

## 2022-04-19 DIAGNOSIS — M79675 Pain in left toe(s): Secondary | ICD-10-CM | POA: Diagnosis not present

## 2022-04-19 DIAGNOSIS — E1142 Type 2 diabetes mellitus with diabetic polyneuropathy: Secondary | ICD-10-CM | POA: Diagnosis not present

## 2022-04-19 DIAGNOSIS — B351 Tinea unguium: Secondary | ICD-10-CM | POA: Diagnosis not present

## 2022-04-19 DIAGNOSIS — L853 Xerosis cutis: Secondary | ICD-10-CM

## 2022-04-19 DIAGNOSIS — Z992 Dependence on renal dialysis: Secondary | ICD-10-CM

## 2022-04-19 DIAGNOSIS — Z91199 Patient's noncompliance with other medical treatment and regimen due to unspecified reason: Secondary | ICD-10-CM

## 2022-04-19 DIAGNOSIS — M79674 Pain in right toe(s): Secondary | ICD-10-CM | POA: Diagnosis not present

## 2022-04-19 MED ORDER — AMMONIUM LACTATE 12 % EX LOTN: 1.0000 "application " | TOPICAL_LOTION | CUTANEOUS | 5 refills | Status: AC | PRN

## 2022-04-19 NOTE — Progress Notes (Signed)
   Complete physical exam  Patient: Donald Alvarez   DOB: 09/07/2001   56 y.o. Male  MRN: [id number]  Subjective:    No chief complaint on file.   Donald Alvarez is a 56 y.o. male who presents today for a complete physical exam. She reports consuming a {diet types:17450} diet. {types:19826} She generally feels {DESC; WELL/FAIRLY WELL/POORLY:18703}. She reports sleeping {DESC; WELL/FAIRLY WELL/POORLY:18703}. She {does/does not:200015} have additional problems to discuss today.    Most recent fall risk assessment:    05/15/2022   10:42 AM  Fall Risk   Falls in the past year? 0  Number falls in past yr: 0  Injury with Fall? 0  Risk for fall due to : No Fall Risks  Follow up Falls evaluation completed     Most recent depression screenings:    05/15/2022   10:42 AM 04/05/2021   10:46 AM  PHQ 2/9 Scores  PHQ - 2 Score 0 0  PHQ- 9 Score 5     {VISON DENTAL STD PSA (Optional):27386}  {History (Optional):23778}  Patient Care Team: Jessup, Joy, NP as PCP - General (Nurse Practitioner)   Outpatient Medications Prior to Visit  Medication Sig   fluticasone (FLONASE) 50 MCG/ACT nasal spray Place 2 sprays into both nostrils in the morning and at bedtime. After 7 days, reduce to once daily.   norgestimate-ethinyl estradiol (SPRINTEC 28) 0.25-35 MG-MCG tablet Take 1 tablet by mouth daily.   Nystatin POWD Apply liberally to affected area 2 times per day   spironolactone (ALDACTONE) 100 MG tablet Take 1 tablet (100 mg total) by mouth daily.   No facility-administered medications prior to visit.    ROS        Objective:     There were no vitals taken for this visit. {Vitals History (Optional):23777}  Physical Exam   No results found for any visits on 06/20/22. {Show previous labs (optional):23779}    Assessment & Plan:    Routine Health Maintenance and Physical Exam  Immunization History  Administered Date(s) Administered   DTaP 11/21/1999, 01/17/2000,  03/27/2000, 12/11/2000, 06/26/2004   Hepatitis A 04/22/2008, 04/28/2009   Hepatitis B 09/08/1999, 10/16/1999, 03/27/2000   HiB (PRP-OMP) 11/21/1999, 01/17/2000, 03/27/2000, 12/11/2000   IPV 11/21/1999, 01/17/2000, 09/15/2000, 06/26/2004   Influenza,inj,Quad PF,6+ Mos 07/29/2014   Influenza-Unspecified 10/28/2012   MMR 09/15/2001, 06/26/2004   Meningococcal Polysaccharide 04/27/2012   Pneumococcal Conjugate-13 12/11/2000   Pneumococcal-Unspecified 03/27/2000, 06/10/2000   Tdap 04/27/2012   Varicella 09/15/2000, 04/22/2008    Health Maintenance  Topic Date Due   HIV Screening  Never done   Hepatitis C Screening  Never done   INFLUENZA VACCINE  06/18/2022   PAP-Cervical Cytology Screening  06/20/2022 (Originally 09/06/2020)   PAP SMEAR-Modifier  06/20/2022 (Originally 09/06/2020)   TETANUS/TDAP  06/20/2022 (Originally 04/27/2022)   HPV VACCINES  Discontinued   COVID-19 Vaccine  Discontinued    Discussed health benefits of physical activity, and encouraged her to engage in regular exercise appropriate for her age and condition.  Problem List Items Addressed This Visit   None Visit Diagnoses     Annual physical exam    -  Primary   Cervical cancer screening       Need for Tdap vaccination          No follow-ups on file.     Joy Jessup, NP   

## 2022-04-20 DIAGNOSIS — N186 End stage renal disease: Secondary | ICD-10-CM | POA: Diagnosis not present

## 2022-04-20 DIAGNOSIS — Z992 Dependence on renal dialysis: Secondary | ICD-10-CM | POA: Diagnosis not present

## 2022-04-20 DIAGNOSIS — N2581 Secondary hyperparathyroidism of renal origin: Secondary | ICD-10-CM | POA: Diagnosis not present

## 2022-04-23 DIAGNOSIS — N186 End stage renal disease: Secondary | ICD-10-CM | POA: Diagnosis not present

## 2022-04-23 DIAGNOSIS — N2581 Secondary hyperparathyroidism of renal origin: Secondary | ICD-10-CM | POA: Diagnosis not present

## 2022-04-23 DIAGNOSIS — Z992 Dependence on renal dialysis: Secondary | ICD-10-CM | POA: Diagnosis not present

## 2022-04-24 ENCOUNTER — Other Ambulatory Visit: Payer: Medicare HMO

## 2022-04-25 DIAGNOSIS — N186 End stage renal disease: Secondary | ICD-10-CM | POA: Diagnosis not present

## 2022-04-25 DIAGNOSIS — N2581 Secondary hyperparathyroidism of renal origin: Secondary | ICD-10-CM | POA: Diagnosis not present

## 2022-04-25 DIAGNOSIS — Z992 Dependence on renal dialysis: Secondary | ICD-10-CM | POA: Diagnosis not present

## 2022-04-25 NOTE — Progress Notes (Signed)
  Subjective:  Patient ID: Donald Alvarez, adult    DOB: 1966/04/08,  MRN: [id number]  Fleet Schussler presents to clinic today for at risk foot care with h/o NIDDM with ESRD on hemodialysis and callus(es) b/l lower extremities and painful thick toenails that are difficult to trim. Painful toenails interfere with ambulation. Aggravating factors include wearing enclosed shoe gear. Pain is relieved with periodic professional debridement. Painful calluses are aggravated when weightbearing with and without shoegear. Pain is relieved with periodic professional debridement.  Patient states blood glucose was 110 mg/dl.  Last known HgA1c was 5.7%.  New problem(s): Dry, flaky skin bilaterally. Sometimes itchy.  Patient states he received his diabetic shoes, but it feels his insoles are not supportive enough in the arch area.  PCP is [name], NP , and last visit was 4 months ago.  Allergies  Allergen Reactions   Iodinated Contrast Media Hives and Anaphylaxis   Iodine Hives    Contrast dye   Lisinopril Hives    Other reaction(s): Cough, Other (See Comments) cough    Other Other (See Comments)    Itching and rash from "ultrasound gel"   Ultrasound Gel Itching and Rash    Review of Systems: Negative except as noted in the HPI.  Objective: No changes noted in today's physical examination.  Ziah Medders is a pleasant 56 y.o. male in NAD. AAO X 3.  Vascular Examination: CFT <3 seconds b/l LE. Palpable DP pulse(s) b/l LE. Palpable PT pulse(s) b/l LE. Pedal hair absent. No pain with calf compression b/l. Lower extremity skin temperature gradient within normal limits. No edema noted b/l LE. No ischemia or gangrene noted b/l LE. No cyanosis or clubbing noted b/l LE.  Dermatological Examination: Pedal skin thin and atrophic b/l LE. No open wounds b/l LE. No interdigital macerations noted b/l LE. Toenails 1-5 b/l elongated, discolored, dystrophic, thickened, crumbly with subungual debris and  tenderness to dorsal palpation. Hyperkeratotic lesion(s) L hallux, submet head 1 b/l, submet head 3 b/l, and submet head 4 b/l.  No erythema, no edema, no drainage, no fluctuance.  Musculoskeletal Examination: Muscle strength 5/5 to all lower extremity muscle groups bilaterally. Hammertoe deformity noted 2-5 b/l. Pes planus deformity noted bilateral LE. Patient ambulates independent of any assistive aids.  Neurological Examination: Pt has subjective symptoms of neuropathy. Protective sensation diminished with 10g monofilament b/l. Vibratory sensation intact b/l. Proprioception intact bilaterally.  Assessment/Plan: 1. Onychomycosis   2. Dry skin dermatitis   3. Callus   4. ESRD on dialysis ([place])   5. DM type 2 with diabetic peripheral neuropathy ([place])     -Patient was evaluated and treated. All patient's and/or POA's questions/concerns answered on today's visit. -For dermatitis: Meds ordered this encounter  Medications   ammonium lactate (LAC-HYDRIN) 12 % lotion    Sig: Apply 1 application. topically as needed for dry skin.    Dispense:  400 g    Refill:  5   -Patient advised to schedule appointment with Pedorthist to have arch area of insoles built up. -Mycotic toenails 1-5 bilaterally were debrided in length and girth with sterile nail nippers and dremel without incident. -Callus(es) left great toe, submet head 1 b/l, submet head 3 b/l, and submet head 4 b/l pared utilizing sterile scalpel blade without complication or incident. Total number debrided =7. -Patient/POA to call should there be question/concern in the interim.   Return in about 3 months (around 07/20/2022).  Marzetta Board, DPM

## 2022-04-26 ENCOUNTER — Encounter: Payer: Self-pay | Admitting: Medical-Surgical

## 2022-04-26 ENCOUNTER — Ambulatory Visit (INDEPENDENT_AMBULATORY_CARE_PROVIDER_SITE_OTHER): Payer: Medicare HMO | Admitting: Medical-Surgical

## 2022-04-26 VITALS — BP 177/87 | HR 70 | Resp 20 | Ht 73.0 in | Wt 170.1 lb

## 2022-04-26 DIAGNOSIS — I151 Hypertension secondary to other renal disorders: Secondary | ICD-10-CM | POA: Diagnosis not present

## 2022-04-26 DIAGNOSIS — E538 Deficiency of other specified B group vitamins: Secondary | ICD-10-CM

## 2022-04-26 DIAGNOSIS — N2889 Other specified disorders of kidney and ureter: Secondary | ICD-10-CM | POA: Diagnosis not present

## 2022-04-26 DIAGNOSIS — R7989 Other specified abnormal findings of blood chemistry: Secondary | ICD-10-CM

## 2022-04-26 DIAGNOSIS — E118 Type 2 diabetes mellitus with unspecified complications: Secondary | ICD-10-CM | POA: Diagnosis not present

## 2022-04-26 DIAGNOSIS — Z992 Dependence on renal dialysis: Secondary | ICD-10-CM

## 2022-04-26 DIAGNOSIS — M109 Gout, unspecified: Secondary | ICD-10-CM | POA: Diagnosis not present

## 2022-04-26 DIAGNOSIS — E559 Vitamin D deficiency, unspecified: Secondary | ICD-10-CM

## 2022-04-26 DIAGNOSIS — N186 End stage renal disease: Secondary | ICD-10-CM | POA: Diagnosis not present

## 2022-04-26 LAB — POCT GLYCOSYLATED HEMOGLOBIN (HGB A1C): HbA1c, POC (controlled diabetic range): 5.3 % (ref 0.0–7.0)

## 2022-04-26 MED ORDER — ALLOPURINOL 100 MG PO TABS: 100.0000 mg | ORAL_TABLET | Freq: Every day | ORAL | 1 refills | Status: AC

## 2022-04-26 MED ORDER — OMEPRAZOLE 40 MG PO CPDR: 40.0000 mg | DELAYED_RELEASE_CAPSULE | Freq: Every day | ORAL | 1 refills | Status: AC

## 2022-04-26 MED ORDER — ZOLPIDEM TARTRATE 10 MG PO TABS: 10.0000 mg | ORAL_TABLET | Freq: Every evening | ORAL | 2 refills | Status: AC | PRN

## 2022-04-26 MED ORDER — LIDOCAINE-PRILOCAINE 2.5-2.5 % EX CREA: 1.0000 "application " | TOPICAL_CREAM | CUTANEOUS | 11 refills | Status: AC

## 2022-04-26 MED ORDER — AMLODIPINE BESYLATE 10 MG PO TABS: 10.0000 mg | ORAL_TABLET | Freq: Every day | ORAL | 1 refills | Status: AC

## 2022-04-26 MED ORDER — NOVOLOG MIX 70/30 (70-30) 100 UNIT/ML ~~LOC~~ SUSP: 10.0000 [IU] | Freq: Two times a day (BID) | SUBCUTANEOUS | 6 refills | Status: DC | PRN

## 2022-04-26 NOTE — Progress Notes (Signed)
Established Patient Office Visit  Subjective   Patient ID: Donald Alvarez, nonbinary   DOB: 06/28/1966 Age: 56 y.o. MRN: [id number]   Chief Complaint  Patient presents with   Follow-up   HPI Pleasant 55 year old patient presenting today for follow-up on chronic diseases.  ESRD: Currently followed by Dr. Augustin Coupe.  Does dialysis Tuesdays, Thursdays, and Saturdays each week.  Is on the transplant list and has been told not to travel very far so he feels that he is getting close to being called for a kidney transplant.  HTN: Currently taking carvedilol 25 mg twice daily, amlodipine 10 mg daily, and hydralazine twice daily.  Monitors his blood pressure closely at home.  Notes his blood pressure can be very labile and tends to drop at dialysis only to rebound the next day.  Today has taken hydralazine and carvedilol but has not taken his amlodipine since he takes this at bedtime.  DM: Has been using NovoLog 70/30 10 units twice daily on an as-needed basis and is compliant with this.  He is following a diabetic diet and working to keep everything in check since he knows that he needs to keep things controlled for the transplant list.  Gout: Taking allopurinol 100 mg daily, tolerating well without side effects.  No recent gout flares.  Vitamin B12 deficiency: Has not had his levels rechecked in a while and would like to have them done today.  Vitamin D deficiency: Regularly monitored at dialysis due to administration of Hectorol.  History of low testosterone would like to have this checked today.  Review of Systems  Constitutional:  Negative for chills, fever and malaise/fatigue.  Respiratory:  Negative for cough, shortness of breath and wheezing.   Cardiovascular:  Negative for chest pain, palpitations and leg swelling.  Neurological:  Negative for dizziness and headaches.  Psychiatric/Behavioral:  Negative for depression and suicidal ideas. The patient is not nervous/anxious and does not have  insomnia.       Objective:    Vitals:   04/26/22 1049  BP: (!) 177/87  Pulse: 70  Resp: 20  Height: 6\' 1"  (1.854 m)  Weight: 170 lb 1.6 oz (77.2 kg)  SpO2: 99%  BMI (Calculated): 22.45     Physical Exam Vitals reviewed.  Constitutional:      General: Donald Alvarez is not in acute distress.    Appearance: Normal appearance. Donald Alvarez is not ill-appearing.  HENT:     Head: Normocephalic and atraumatic.  Cardiovascular:     Rate and Rhythm: Normal rate.     Pulses: Normal pulses.     Heart sounds: Normal heart sounds. No murmur heard.    No friction rub. No gallop.  Pulmonary:     Effort: Pulmonary effort is normal. No respiratory distress.     Breath sounds: Normal breath sounds.  Skin:    General: Skin is warm and dry.  Neurological:     Mental Status: Donald Alvarez is alert and oriented to person, place, and time.  Psychiatric:        Mood and Affect: Mood normal.        Behavior: Behavior normal.        Thought Content: Thought content normal.        Judgment: Judgment normal.    Results for orders placed or performed in visit on 04/26/22 (from the past 24 hour(s))  POCT HgB A1C     Status: None   Collection Time: 04/26/22 11:05 AM  Result Value Ref Range  Hemoglobin A1C     HbA1c POC (<> result, manual entry)     HbA1c, POC (prediabetic range)     HbA1c, POC (controlled diabetic range) 5.3 0.0 - 7.0 %       The ASCVD Risk score (Arnett DK, et al., 2019) failed to calculate for the following reasons:   Cannot find a previous HDL lab   Cannot find a previous total cholesterol lab   Assessment & Plan:   Hypertension secondary to other renal disorders Blood pressure significantly elevated on arrival and recheck.  He will be due for dialysis tomorrow.  In the meantime, limit sodium intake and monitor fluid status.  Recommend monitoring his blood pressure today at home to see if this improves once he is at home and relaxed.  If blood pressure is  not better, he needs to discuss this with his nephrologist tomorrow since they do have to watch for hypotension while on treatment.  Type 2 diabetes mellitus with complication (HCC) POCT hemoglobin A1c 5.3%.  Continue NovoLog 70/30 10 units twice daily as needed.  ESRD on dialysis [facility]) Very pleasant compliant dialysis patient.  Continue to attend dialysis treatment and follow-up appointments.  Hoping that he gets a call for transplant soon.  Gout Continue allopurinol 100 mg daily.  Low testosterone Checking testosterone today.  Vitamin B12 deficiency Checking vitamin B-12 today.  Vitamin D deficiency Stable and closely monitored with dialysis.  Return in about 6 months (around 10/26/2022) for chronic disease follow up.  ___________________________________________ Clearnce Sorrel, DNP, APRN, FNP-BC [facility] [place]

## 2022-04-26 NOTE — Assessment & Plan Note (Signed)
Blood pressure significantly elevated on arrival and recheck.  He will be due for dialysis tomorrow.  In the meantime, limit sodium intake and monitor fluid status.  Recommend monitoring his blood pressure today at home to see if this improves once he is at home and relaxed.  If blood pressure is not better, he needs to discuss this with his nephrologist tomorrow since they do have to watch for hypotension while on treatment.

## 2022-04-26 NOTE — Assessment & Plan Note (Signed)
Checking vitamin B-12 today.

## 2022-04-26 NOTE — Assessment & Plan Note (Signed)
POCT hemoglobin A1c 5.3%.  Continue NovoLog 70/30 10 units twice daily as needed.

## 2022-04-26 NOTE — Assessment & Plan Note (Signed)
Stable and closely monitored with dialysis.

## 2022-04-26 NOTE — Assessment & Plan Note (Signed)
Continue allopurinol 100 mg daily. 

## 2022-04-26 NOTE — Assessment & Plan Note (Signed)
Checking testosterone today.

## 2022-04-26 NOTE — Assessment & Plan Note (Signed)
Very pleasant compliant dialysis patient.  Continue to attend dialysis treatment and follow-up appointments.  Hoping that he gets a call for transplant soon.

## 2022-04-27 DIAGNOSIS — Z992 Dependence on renal dialysis: Secondary | ICD-10-CM | POA: Diagnosis not present

## 2022-04-27 DIAGNOSIS — N186 End stage renal disease: Secondary | ICD-10-CM | POA: Diagnosis not present

## 2022-04-27 DIAGNOSIS — N2581 Secondary hyperparathyroidism of renal origin: Secondary | ICD-10-CM | POA: Diagnosis not present

## 2022-04-27 LAB — VITAMIN B12: Vitamin B-12: 765 pg/mL (ref 200–1100)

## 2022-04-27 LAB — TESTOSTERONE TOTAL,FREE,BIO, MALES
Albumin: 4.8 g/dL (ref 3.6–5.1)
Sex Hormone Binding: 56 nmol/L (ref 22–77)
Testosterone, Bioavailable: 63.9 ng/dL — ABNORMAL LOW (ref 110.0–575.0)
Testosterone, Free: 29.2 pg/mL — ABNORMAL LOW (ref 46.0–224.0)
Testosterone: 361 ng/dL (ref 250–827)

## 2022-04-30 DIAGNOSIS — N2581 Secondary hyperparathyroidism of renal origin: Secondary | ICD-10-CM | POA: Diagnosis not present

## 2022-04-30 DIAGNOSIS — Z992 Dependence on renal dialysis: Secondary | ICD-10-CM | POA: Diagnosis not present

## 2022-04-30 DIAGNOSIS — N186 End stage renal disease: Secondary | ICD-10-CM | POA: Diagnosis not present

## 2022-05-02 ENCOUNTER — Encounter: Payer: Self-pay | Admitting: Medical-Surgical

## 2022-05-02 DIAGNOSIS — Z992 Dependence on renal dialysis: Secondary | ICD-10-CM | POA: Diagnosis not present

## 2022-05-02 DIAGNOSIS — N186 End stage renal disease: Secondary | ICD-10-CM | POA: Diagnosis not present

## 2022-05-02 DIAGNOSIS — N2581 Secondary hyperparathyroidism of renal origin: Secondary | ICD-10-CM | POA: Diagnosis not present

## 2022-05-02 MED ORDER — NOVOLIN 70/30 FLEXPEN (70-30) 100 UNIT/ML ~~LOC~~ SUPN: 10.0000 [IU] | PEN_INJECTOR | Freq: Two times a day (BID) | SUBCUTANEOUS | 11 refills | Status: AC | PRN

## 2022-05-04 DIAGNOSIS — Z992 Dependence on renal dialysis: Secondary | ICD-10-CM | POA: Diagnosis not present

## 2022-05-04 DIAGNOSIS — N2581 Secondary hyperparathyroidism of renal origin: Secondary | ICD-10-CM | POA: Diagnosis not present

## 2022-05-04 DIAGNOSIS — N186 End stage renal disease: Secondary | ICD-10-CM | POA: Diagnosis not present

## 2022-05-07 DIAGNOSIS — N186 End stage renal disease: Secondary | ICD-10-CM | POA: Diagnosis not present

## 2022-05-07 DIAGNOSIS — N2581 Secondary hyperparathyroidism of renal origin: Secondary | ICD-10-CM | POA: Diagnosis not present

## 2022-05-07 DIAGNOSIS — Z992 Dependence on renal dialysis: Secondary | ICD-10-CM | POA: Diagnosis not present

## 2022-05-09 ENCOUNTER — Encounter: Payer: Self-pay | Admitting: Family Medicine

## 2022-05-09 ENCOUNTER — Emergency Department (INDEPENDENT_AMBULATORY_CARE_PROVIDER_SITE_OTHER)
Admission: EM | Admit: 2022-05-09 | Discharge: 2022-05-09 | Disposition: A | Discharge status: Home / Self Care | Payer: Medicare HMO | Source: Home / Self Care

## 2022-05-09 DIAGNOSIS — N186 End stage renal disease: Secondary | ICD-10-CM | POA: Diagnosis not present

## 2022-05-09 DIAGNOSIS — N2581 Secondary hyperparathyroidism of renal origin: Secondary | ICD-10-CM | POA: Diagnosis not present

## 2022-05-09 DIAGNOSIS — M10072 Idiopathic gout, left ankle and foot: Secondary | ICD-10-CM

## 2022-05-09 DIAGNOSIS — Z992 Dependence on renal dialysis: Secondary | ICD-10-CM | POA: Diagnosis not present

## 2022-05-09 MED ORDER — COLCHICINE 0.6 MG PO TABS: ORAL_TABLET | ORAL | 0 refills | Status: AC

## 2022-05-09 NOTE — Discharge Instructions (Addendum)
Instructed patient to discontinue Allopurinol. Instructed patient to take medication as directed.  Encouraged patient to increase daily water intake while taking this medication.  Advised if symptoms worsen and/or unresolved please follow-up with PCP or here for further evaluation.

## 2022-05-09 NOTE — ED Triage Notes (Signed)
Pt presents to Urgent Care with c/o L great toe pain since this afternoon. Hx of gout.

## 2022-05-09 NOTE — ED Provider Notes (Signed)
[facility]    CSN: [id number] Arrival date & time: 05/09/22  1859      History   Chief Complaint Chief Complaint  Patient presents with   Toe Pain    HPI Donald Alvarez is a 56 y.o. adult.   HPI Pleasant 56 year old male presents with left toe pain since this afternoon.  PMH significant for chronic idiopathic gout involving toe of left foot without tophus, ESRD on dialysis, LVH, CAD, and T2DM.  Patient is accompanied by his wife this evening.  Past Medical History:  Diagnosis Date   Anaphylactic shock, unspecified, initial encounter 10/09/2021   Anemia    Complication of anesthesia    Pt stated that his blood preesure must be monitored closely during any surgical procedure ( per neurologist)   Diabetes mellitus without complication ([place])    ESRD on dialysis ([place])    Gout    HLD (hyperlipidemia)    Hyperparathyroidism due to renal insufficiency ([place])    Hypertension    Renal disorder     Patient Active Problem List   Diagnosis Date Noted   Type 2 diabetes mellitus with complication ([place]) 00/76/2263   Unspecified severe protein-calorie malnutrition ([place]) 12/06/2021   Low testosterone 06/08/2021   Other male erectile dysfunction 06/08/2021   Vitamin B12 deficiency 06/08/2021   Vitamin D deficiency 06/08/2021   Chest pain, atypical 03/14/2021   Contrast media allergy 03/14/2021   Coronary artery disease involving native coronary artery of native heart with angina pectoris ([place]) 03/14/2021   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[place]) 03/13/2021   Kidney transplant candidate 03/13/2021   Diarrhea, unspecified 01/25/2021   Abnormal CT scan, chest 01/24/2021   Hammertoes of both feet 03/01/2020   Numbness and tingling of both feet 03/01/2020   Onychomycosis due to dermatophyte 03/01/2020   Chronic cough 12/11/2018   HAP (hospital-acquired pneumonia) 07/20/2018   Left leg numbness 05/19/2018   LVH (left ventricular hypertrophy) 05/19/2018   Hyperkalemia 09/17/2017    Disorder of phosphorus metabolism, unspecified 06/17/2017   Anemia in chronic kidney disease 06/16/2017   Dyspnea, unspecified 06/16/2017   Secondary hyperparathyroidism of renal origin ([place]) 06/16/2017   Gout 07/09/2016   HLD (hyperlipidemia) 07/09/2016   ESRD on dialysis [facility])    Chronic idiopathic gout involving toe of left foot without tophus 10/11/2014   Hypertension secondary to other renal disorders 10/11/2014   Bilateral edema of lower extremity 05/20/2014   HTN (hypertension) 05/20/2014   Polycystic kidney disease, autosomal dominant 05/20/2014   Gout flare 05/20/2014   Neuropathy in diabetes ([place]) 11/23/2013   Plantar fasciitis of left foot 11/23/2013   Callus of foot 11/23/2013   Porokeratosis 11/23/2013    Past Surgical History:  Procedure Laterality Date   AV FISTULA PLACEMENT Left 07/17/2015   Procedure: Left Upper Arm  ARTERIOVENOUS FISTULA CREATION;  Surgeon: [name], MD;  Location: [facility] OR;  Service: Vascular;  Laterality: Left;   FISTULA PLUG     NO PAST SURGERIES      OB History   No obstetric history on file.      Home Medications    Prior to Admission medications   Medication Sig Start Date End Date Taking? Authorizing Provider  colchicine 0.6 MG tablet Take 2 tabs p.o. now, may repeat 1 tab in 1 to 2 hours if pain is still present.  Do not exceed more than 3 tablets in 24 hours. 05/09/22  Yes Eliezer Lofts, FNP  allopurinol (ZYLOPRIM) 100 MG tablet Take 1 tablet (100 mg total) by  mouth daily. 04/26/22   [name], NP  amLODipine (NORVASC) 10 MG tablet Take 1 tablet (10 mg total) by mouth daily. 04/26/22   [name], NP  ammonium lactate (LAC-HYDRIN) 12 % lotion Apply 1 application. topically as needed for dry skin. 04/19/22   Marzetta Board, DPM  aspirin 81 MG tablet Take 81 mg by mouth daily.    [provider]  atorvastatin (LIPITOR) 40 MG tablet Take by mouth. 03/14/21   [provider]  B Complex-C-Folic Acid (DIALYVITE 161)  0.8 MG TABS Take 1 tablet by mouth daily as needed. 11/16/18   [provider]  carvedilol (COREG) 25 MG tablet Take 25 mg by mouth 2 (two) times daily with a meal.    [provider]  cinacalcet (SENSIPAR) 30 MG tablet Take 30 mg by mouth daily. 12/21/20   [provider]  Doxercalciferol (HECTOROL IV) Doxercalciferol (Hectorol) 04/11/22 04/10/23  [provider]  ferric citrate (AURYXIA) 1 GM 210 MG(Fe) tablet Take by mouth. 12/29/21   [provider]  hydrALAZINE (APRESOLINE) 100 MG tablet Take 100 mg by mouth 2 (two) times daily. 07/24/18   [provider]  insulin isophane & regular human KwikPen (NOVOLIN 70/30 KWIKPEN) (70-30) 100 UNIT/ML KwikPen Inject 10 Units into the skin 2 (two) times daily as needed. 05/02/22   [name], NP  Insulin Pen Needle (PEN NEEDLES) 31G X 5 MM MISC Use to injection insulin twice daily. 07/13/21   [name], NP  lidocaine-prilocaine (EMLA) cream Apply 1 application  topically See admin instructions. With dialysis Tuesday,thursday,saturday 04/26/22   [name], NP  LOKELMA 10 g PACK packet Take 1 packet by mouth daily. 12/05/20   [provider]  Methoxy PEG-Epoetin Beta (MIRCERA IJ) Mircera 08/14/21 01/14/23  [provider]  nateglinide (STARLIX) 60 MG tablet Take by mouth.    [provider]  omeprazole (PRILOSEC) 40 MG capsule Take 1 capsule (40 mg total) by mouth daily. 1 tablet before dinner daily 04/26/22   [name], NP  triamcinolone ointment (KENALOG) 0.1 % Apply 1 application topically 2 (two) times daily. To affected areas 05/18/21   [name], NP  zolpidem (AMBIEN) 10 MG tablet Take 1 tablet (10 mg total) by mouth at bedtime as needed. 04/26/22   [name], NP    Family History Family History  Problem Relation Age of Onset   Diabetes Mellitus II Mother    Kidney disease Mother    Diabetes Mother    Hypertension Mother    CAD Father    Heart disease Father    Hypertension  Father     Social History Social History   Tobacco Use   Smoking status: Never   Smokeless tobacco: Never  Vaping Use   Vaping Use: Never used  Substance Use Topics   Alcohol use: Never   Drug use: Never     Allergies   Iodinated contrast media, Iodine, Lisinopril, and Other   Review of Systems Review of Systems  Musculoskeletal:        Left great toe pain since this afternoon     Physical Exam Triage Vital Signs ED Triage Vitals [05/09/22 1907]  Enc Vitals Group     BP      Pulse      Resp      Temp      Temp src      SpO2      Weight 167 lb 8.8 oz (76 kg)  Height 6\' 2"  (1.88 m)     Head Circumference      Peak Flow      Pain Score 6     Pain Loc      Pain Edu?      Excl. in [place]?    No data found.  Updated Vital Signs BP (!) 192/94 (BP Location: Right Arm)   Pulse 82   Temp 98.7 F (37.1 C) (Oral)   Resp 20   Ht 6\' 2"  (1.88 m)   Wt 167 lb 8.8 oz (76 kg)   SpO2 97%   BMI 21.51 kg/m     Physical Exam Vitals and nursing note reviewed.  Constitutional:      General: Donald Alvarez is not in acute distress.    Appearance: Normal appearance. Donald Alvarez is normal weight. Donald Alvarez is not ill-appearing.  HENT:     Head: Normocephalic and atraumatic.     Mouth/Throat:     Mouth: Mucous membranes are moist.     Pharynx: Oropharynx is clear.  Eyes:     Extraocular Movements: Extraocular movements intact.     Conjunctiva/sclera: Conjunctivae normal.     Pupils: Pupils are equal, round, and reactive to light.  Cardiovascular:     Rate and Rhythm: Normal rate and regular rhythm.     Pulses: Normal pulses.     Heart sounds: Normal heart sounds.  Pulmonary:     Effort: Pulmonary effort is normal.     Breath sounds: Normal breath sounds. No wheezing, rhonchi or rales.  Musculoskeletal:     Cervical back: Normal range of motion and neck supple.     Comments: Left great toe: TTP over lateral aspect, erythematous, warm to touch, mild  soft tissue swelling noted  Skin:    General: Skin is warm and dry.  Neurological:     General: No focal deficit present.     Mental Status: Donald Alvarez is alert and oriented to person, place, and time.      UC Treatments / Results  Labs (all labs ordered are listed, but only abnormal results are displayed) Labs Reviewed - No data to display  EKG   Radiology No results found.  Procedures Procedures (including critical care time)  Medications Ordered in UC Medications - No data to display  Initial Impression / Assessment and Plan / UC Course  I have reviewed the triage vital signs and the nursing notes.  Pertinent labs & imaging results that were available during my care of the patient were reviewed by me and considered in my medical decision making (see chart for details).     MDM: 1.  Acute idiopathic gout involving toe of left foot-Rx'd Colchicine. Instructed patient to discontinue Allopurinol. Instructed patient to take medication as directed.  Encouraged patient to increase daily water intake while taking this medication.  Advised if symptoms worsen and/or unresolved please follow-up with PCP or here for further evaluation.  Patient discharged home, hemodynamically stable.   Final Clinical Impressions(s) / UC Diagnoses   Final diagnoses:  Acute idiopathic gout involving toe of left foot     Discharge Instructions      Instructed patient to discontinue Allopurinol. Instructed patient to take medication as directed.  Encouraged patient to increase daily water intake while taking this medication.  Advised if symptoms worsen and/or unresolved please follow-up with PCP or here for further evaluation.     ED Prescriptions     Medication Sig Dispense Auth. Provider   colchicine  0.6 MG tablet Take 2 tabs p.o. now, may repeat 1 tab in 1 to 2 hours if pain is still present.  Do not exceed more than 3 tablets in 24 hours. 30 tablet Eliezer Lofts, FNP      PDMP  not reviewed this encounter.   Eliezer Lofts, [place] 05/09/22 1947

## 2022-05-11 DIAGNOSIS — Z992 Dependence on renal dialysis: Secondary | ICD-10-CM | POA: Diagnosis not present

## 2022-05-11 DIAGNOSIS — N2581 Secondary hyperparathyroidism of renal origin: Secondary | ICD-10-CM | POA: Diagnosis not present

## 2022-05-11 DIAGNOSIS — N186 End stage renal disease: Secondary | ICD-10-CM | POA: Diagnosis not present

## 2022-05-14 DIAGNOSIS — N186 End stage renal disease: Secondary | ICD-10-CM | POA: Diagnosis not present

## 2022-05-14 DIAGNOSIS — N2581 Secondary hyperparathyroidism of renal origin: Secondary | ICD-10-CM | POA: Diagnosis not present

## 2022-05-14 DIAGNOSIS — Z992 Dependence on renal dialysis: Secondary | ICD-10-CM | POA: Diagnosis not present

## 2022-05-16 DIAGNOSIS — N186 End stage renal disease: Secondary | ICD-10-CM | POA: Diagnosis not present

## 2022-05-16 DIAGNOSIS — N2581 Secondary hyperparathyroidism of renal origin: Secondary | ICD-10-CM | POA: Diagnosis not present

## 2022-05-16 DIAGNOSIS — Z992 Dependence on renal dialysis: Secondary | ICD-10-CM | POA: Diagnosis not present

## 2022-05-17 DIAGNOSIS — Z992 Dependence on renal dialysis: Secondary | ICD-10-CM | POA: Diagnosis not present

## 2022-05-17 DIAGNOSIS — N186 End stage renal disease: Secondary | ICD-10-CM | POA: Diagnosis not present

## 2022-05-17 DIAGNOSIS — E1122 Type 2 diabetes mellitus with diabetic chronic kidney disease: Secondary | ICD-10-CM | POA: Diagnosis not present

## 2022-05-20 ENCOUNTER — Emergency Department (HOSPITAL_COMMUNITY)
Admission: EM | Admit: 2022-05-20 | Discharge: 2022-06-18 | Disposition: E | Payer: Medicare HMO | Attending: Emergency Medicine | Admitting: Emergency Medicine

## 2022-05-20 ENCOUNTER — Encounter (HOSPITAL_COMMUNITY): Payer: Self-pay | Admitting: Emergency Medicine

## 2022-05-20 DIAGNOSIS — I469 Cardiac arrest, cause unspecified: Secondary | ICD-10-CM | POA: Diagnosis not present

## 2022-05-20 DIAGNOSIS — R140 Abdominal distension (gaseous): Secondary | ICD-10-CM | POA: Insufficient documentation

## 2022-05-20 DIAGNOSIS — R0681 Apnea, not elsewhere classified: Secondary | ICD-10-CM | POA: Diagnosis not present

## 2022-05-20 DIAGNOSIS — N186 End stage renal disease: Secondary | ICD-10-CM | POA: Insufficient documentation

## 2022-05-20 DIAGNOSIS — I120 Hypertensive chronic kidney disease with stage 5 chronic kidney disease or end stage renal disease: Secondary | ICD-10-CM | POA: Insufficient documentation

## 2022-05-20 DIAGNOSIS — T82858A Stenosis of vascular prosthetic devices, implants and grafts, initial encounter: Secondary | ICD-10-CM | POA: Diagnosis not present

## 2022-05-20 DIAGNOSIS — Z7982 Long term (current) use of aspirin: Secondary | ICD-10-CM | POA: Diagnosis not present

## 2022-05-20 DIAGNOSIS — I871 Compression of vein: Secondary | ICD-10-CM | POA: Diagnosis not present

## 2022-05-20 DIAGNOSIS — R404 Transient alteration of awareness: Secondary | ICD-10-CM | POA: Diagnosis not present

## 2022-05-20 DIAGNOSIS — R0902 Hypoxemia: Secondary | ICD-10-CM | POA: Diagnosis not present

## 2022-05-20 DIAGNOSIS — Z992 Dependence on renal dialysis: Secondary | ICD-10-CM | POA: Insufficient documentation

## 2022-05-20 DIAGNOSIS — I499 Cardiac arrhythmia, unspecified: Secondary | ICD-10-CM | POA: Diagnosis not present

## 2022-05-20 NOTE — ED Provider Notes (Addendum)
[facility] EMERGENCY DEPARTMENT Provider Note   CSN: [id number] Arrival date & time: 06/05/2022  5035     History  No chief complaint on file.   Donald Alvarez is a 56 y.o. adult.  56 year old male with history of ESRD on HD and hypertension presents in cardiac arrest.  Patient was apparently an outpatient at the [facility] this morning.  A procedure was being performed on his left upper extremity fistula.  Patient had reportedly been given fentanyl and premedicated with both Prednisone and Benadryl prior to starting the procedure.  Per EMS, patient was noted to develop bradycardia shortly after initiation of procedure.  It is unclear what medications were administered prior to EMS arrival.    Patient appears to have then gone into full cardiac arrest shortly after onset of procedure.  EMS reports that the patient was without pulses for nearly the entirety of their resuscitation attempt.  Shortly after the first dose of epi the patient had a brief return of pulses for approximately 20 seconds.  Thereafter, patient with asystole and no pulses for the next 45 minutes.  Upon arrival to the ED the patient has been in cardiac arrest for approximately 50 minutes.  Patient with continuous CPR for the last 45 minutes.  Patient remains in asystole. Patient was intubated in the field by EMS. IO x 2 placed by EMS in the field.   The history is provided by the patient, the EMS personnel and medical records.  Cardiac Arrest Witnessed by:  Healthcare provider Time since incident:  1 hour Condition upon EMS arrival:  Unresponsive Pulse:  Absent Initial cardiac rhythm per EMS:  Asystole Treatments prior to arrival:  ACLS protocol Medications given prior to ED:  Epinephrine Rhythm on admission to ED:  Asystole ,      Home Medications Prior to Admission medications   Medication Sig Start Date End Date Taking? Authorizing Provider  allopurinol (ZYLOPRIM) 100 MG tablet Take  1 tablet (100 mg total) by mouth daily. 04/26/22   [name], NP  amLODipine (NORVASC) 10 MG tablet Take 1 tablet (10 mg total) by mouth daily. 04/26/22   [name], NP  ammonium lactate (LAC-HYDRIN) 12 % lotion Apply 1 application. topically as needed for dry skin. 04/19/22   Marzetta Board, DPM  aspirin 81 MG tablet Take 81 mg by mouth daily.    [provider]  atorvastatin (LIPITOR) 40 MG tablet Take by mouth. 03/14/21   [provider]  B Complex-C-Folic Acid (DIALYVITE 465) 0.8 MG TABS Take 1 tablet by mouth daily as needed. 11/16/18   [provider]  carvedilol (COREG) 25 MG tablet Take 25 mg by mouth 2 (two) times daily with a meal.    [provider]  cinacalcet (SENSIPAR) 30 MG tablet Take 30 mg by mouth daily. 12/21/20   [provider]  colchicine 0.6 MG tablet Take 2 tabs p.o. now, may repeat 1 tab in 1 to 2 hours if pain is still present.  Do not exceed more than 3 tablets in 24 hours. 05/09/22   Eliezer Lofts, FNP  Doxercalciferol (HECTOROL IV) Doxercalciferol (Hectorol) 04/11/22 04/10/23  [provider]  ferric citrate (AURYXIA) 1 GM 210 MG(Fe) tablet Take by mouth. 12/29/21   [provider]  hydrALAZINE (APRESOLINE) 100 MG tablet Take 100 mg by mouth 2 (two) times daily. 07/24/18   [provider]  insulin isophane & regular human KwikPen (NOVOLIN 70/30 KWIKPEN) (70-30) 100 UNIT/ML  KwikPen Inject 10 Units into the skin 2 (two) times daily as needed. 05/02/22   [name], NP  Insulin Pen Needle (PEN NEEDLES) 31G X 5 MM MISC Use to injection insulin twice daily. 07/13/21   [name], NP  lidocaine-prilocaine (EMLA) cream Apply 1 application  topically See admin instructions. With dialysis Tuesday,thursday,saturday 04/26/22   [name], NP  LOKELMA 10 g PACK packet Take 1 packet by mouth daily. 12/05/20   [provider]  Methoxy PEG-Epoetin Beta (MIRCERA IJ) Mircera 08/14/21 01/14/23  [provider]   nateglinide (STARLIX) 60 MG tablet Take by mouth.    [provider]  omeprazole (PRILOSEC) 40 MG capsule Take 1 capsule (40 mg total) by mouth daily. 1 tablet before dinner daily 04/26/22   [name], NP  triamcinolone ointment (KENALOG) 0.1 % Apply 1 application topically 2 (two) times daily. To affected areas 05/18/21   [name], NP  zolpidem (AMBIEN) 10 MG tablet Take 1 tablet (10 mg total) by mouth at bedtime as needed. 04/26/22   [name], NP      Allergies    Iodinated contrast media, Iodine, Lisinopril, and Other    Review of Systems   Review of Systems  All other systems reviewed and are negative.   Physical Exam Updated Vital Signs There were no vitals taken for this visit. Physical Exam Vitals and nursing note reviewed.  Constitutional:      General: Donald Alvarez is not in acute distress.    Appearance: Donald Alvarez is well-developed.     Comments: Pulseless, CPR in progress, ventilations through ET tube in progress  HENT:     Head: Normocephalic and atraumatic.  Eyes:     Comments: Pupils fixed and dilated bilaterally  Cardiovascular:     Heart sounds: Normal heart sounds.     Comments: Pulseless, no spontaneous respirations, asystole on monitor Pulmonary:     Effort: Pulmonary effort is normal. No respiratory distress.     Breath sounds: Normal breath sounds.  Abdominal:     General: There is no distension.     Tenderness: There is no abdominal tenderness.     Comments: Distended  Musculoskeletal:        General: No deformity.  Skin:    General: Skin is dry.  Neurological:     Comments: Unresponsive, pulseless, in asystole     ED Results / Procedures / Treatments   Labs (all labs ordered are listed, but only abnormal results are displayed) Labs Reviewed - No data to display  EKG None  Radiology No results found.  Procedures Procedures    Medications Ordered in ED Medications - No data to display  ED Course/ Medical  Decision Making/ A&P                           Medical Decision Making   Medical Screen Complete  This patient presented to the ED with complaint of cardiac arrest.  This complaint involves an extensive number of treatment options. The initial differential diagnosis includes, but is not limited to, cardiac arrest  This presentation is: Acute, Self-Limited, Previously Undiagnosed, Uncertain Prognosis, Complicated, Systemic Symptoms, and Threat to Life/Bodily Function  Patient is presenting in cardiac arrest after apparent medical procedure performed at [facility].  Events leading to arrest are unclear to this provider.  Patient appears to have been having a procedure performed on his left arm AV fistula.  Shortly after initiation of the  procedure the patient reportedly became bradycardic and then experienced cardiac arrest.  EMS arrives with the patient intubated in the field.  IO access x2 was established in the field.  Patient has received approximately 50 minutes of CPR prior to arrival to the ED.  No definitive ROSC was ever obtained.  Patient is in asystole upon arrival.  Code was called at 9:54.  Additional resuscitative attempts are futile.  Given uncertain nature of the events leading to the patient's arrest and given that the patient was apparently having a medical procedure performed case discussed with the ME Jesusita Oka).  I feel that the case requires ME review.  Patient's wife updated about the outcome of the case.   Patient's wife understands that the ME will investigate the case.   1400 Addendum - ME declined case.  Patient's cardiac arrest discussed with Rennie Natter, FNP.  Patient's death certificate is to be sent to Rennie Natter, [place] [phone]).   Co morbidities that complicated the patient's evaluation  ESRD on HD, hypertension   Additional history obtained:  Additional history obtained from EMS and Spouse External records from outside sources obtained  and reviewed including prior ED visits and prior Inpatient records.    Cardiac Monitoring:  The patient was maintained on a cardiac monitor.  I personally viewed and interpreted the cardiac monitor which showed an underlying rhythm of: Asystole   Consultations Obtained:  I consulted the medical examiner,  and discussed lab and imaging findings as well as pertinent plan of care.    Problem List / ED Course:  Cardiac arrest   Reevaluation:  After the interventions noted above, I reevaluated the patient and found that they have: improved  Disposition:  After consideration of the diagnostic results and the patients response to treatment, I feel that the patent would benefit from evaluation by medical examiner.    CRITICAL CARE Performed by: Valarie Merino   Total critical care time: 30 minutes  Critical care time was exclusive of separately billable procedures and treating other patients.  Critical care was necessary to treat or prevent imminent or life-threatening deterioration.  Critical care was time spent personally by me on the following activities: development of treatment plan with patient and/or surrogate as well as nursing, discussions with consultants, evaluation of patient's response to treatment, examination of patient, obtaining history from patient or surrogate, ordering and performing treatments and interventions, ordering and review of laboratory studies, ordering and review of radiographic studies, pulse oximetry and re-evaluation of patient's condition.         Final Clinical Impression(s) / ED Diagnoses Final diagnoses:  Cardiac arrest [facility])    Rx / DC Orders ED Discharge Orders     None         Valarie Merino, MD 06/03/2022 1123    Valarie Merino, MD 06/12/2022 1400

## 2022-05-20 NOTE — ED Triage Notes (Signed)
Pt arrived cpr in progress from [facility] post fistula revision , pt received 5 epi 1 calcium and 1 bicarb from ems , intubated with 7.0 tube  PEA 30 mins prior to arrival arrived cpr in progress ,

## 2022-06-18 DEATH — deceased

## 2022-07-31 ENCOUNTER — Ambulatory Visit: Payer: Medicare HMO | Admitting: Podiatry

## 2022-10-28 ENCOUNTER — Ambulatory Visit: Payer: Medicare HMO | Admitting: Medical-Surgical

## 2022-11-23 IMAGING — DX DG CHEST 1V PORT
1 series · 1 of 1 positions shown · non-contrast
Comparison: Two-view chest x-ray 11/29/2020

CLINICAL DATA: Shortness of breath. Patient reports itching in the
upper extremities bilaterally after receiving contrast at 11 a.m.
yesterday for a fistulogram.

EXAM:
PORTABLE CHEST 1 VIEW

[chest ap]
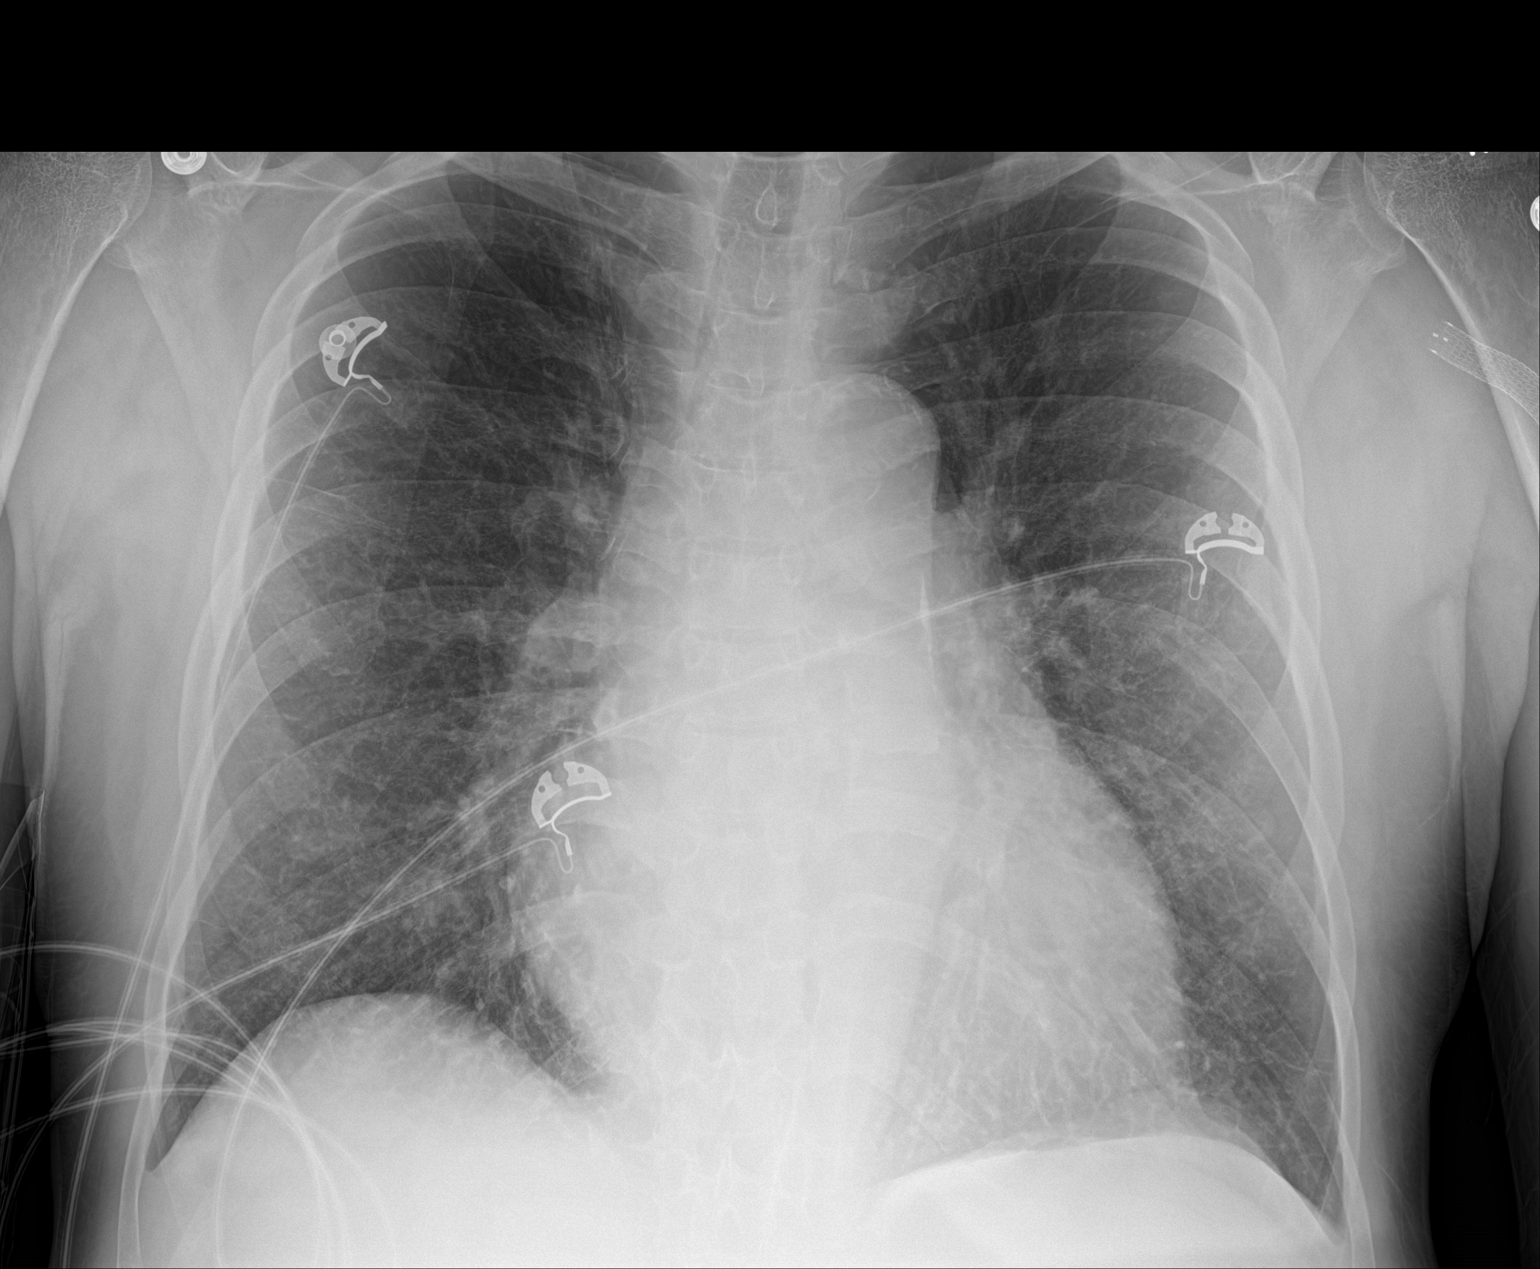

[1 of 1 positions shown; findings below may reference images not displayed]

FINDINGS: Heart size upper limits of normal. Atherosclerotic changes are noted
in the aortic arch. Mild interstitial prominence is present compared
to the prior exam. No focal airspace disease is present. No
significant pleural effusion or pneumothorax is present. Left
axillary stent noted.
IMPRESSION: 1. Mild interstitial prominence suggesting mild edema.
2. No focal airspace disease.

## 2022-12-26 IMAGING — CT CT PARANASAL SINUSES LIMITED
1 series · 13 of 15 positions shown, 17 images · non-contrast
Comparison: None.

CLINICAL DATA: 54-year-old male with chronic sinus drainage and
issues.

EXAM:
CT PARANASAL SINUS LIMITED WITHOUT CONTRAST
TECHNIQUE: Non-contiguous multidetector CT images of the paranasal sinuses were
obtained in a single plane without contrast.

[Series 3: limited sinus st · axial · 0.31mm/px · z∈[-136,-16]mm · 13 of 15 slices shown, 17 images]
[im 2/15  brain]
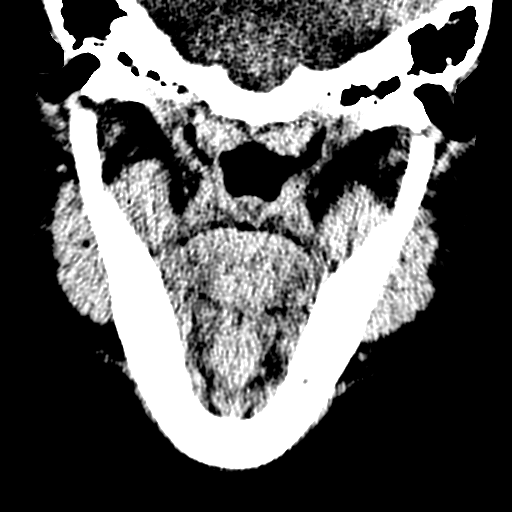
[im 2/15  bone]
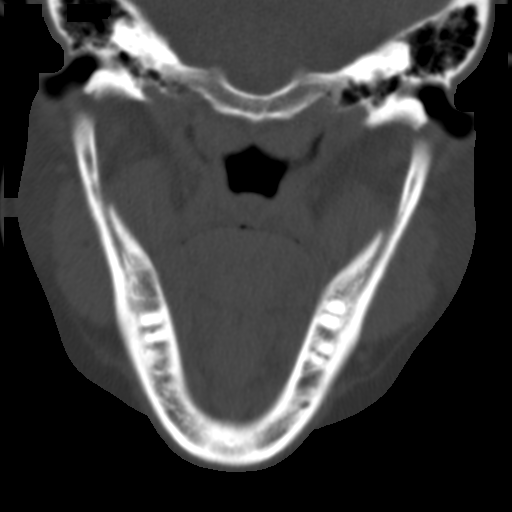
[im 3/15  bone]
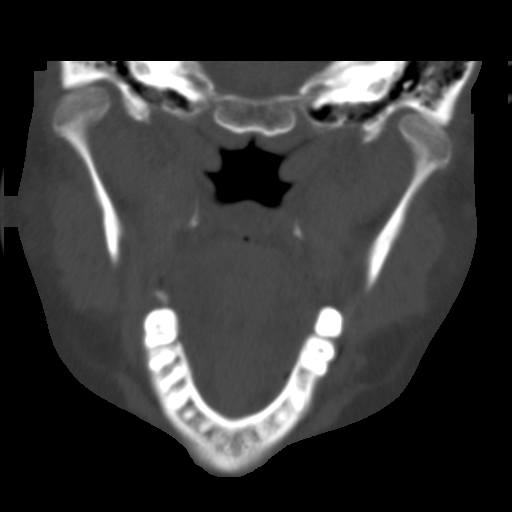
[im 4/15  bone]
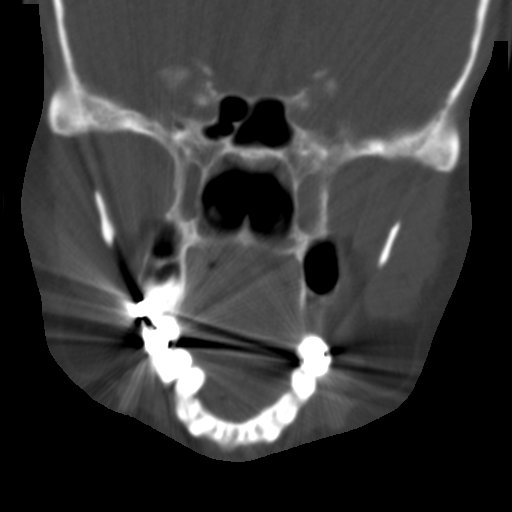
[im 5/15  bone]
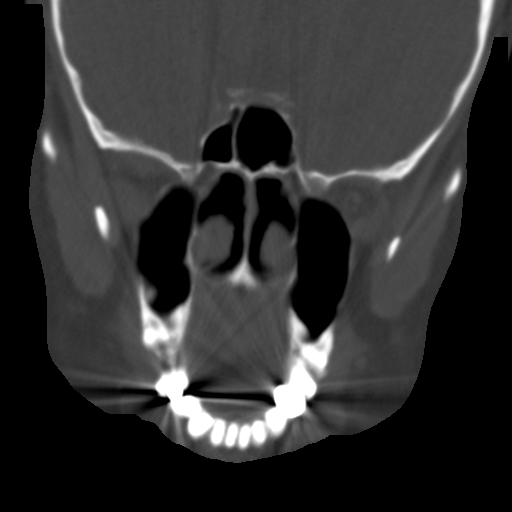
[im 6/15  brain]
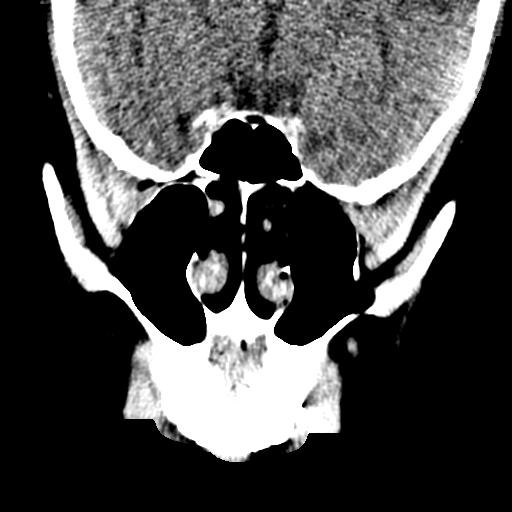
[im 6/15  bone]
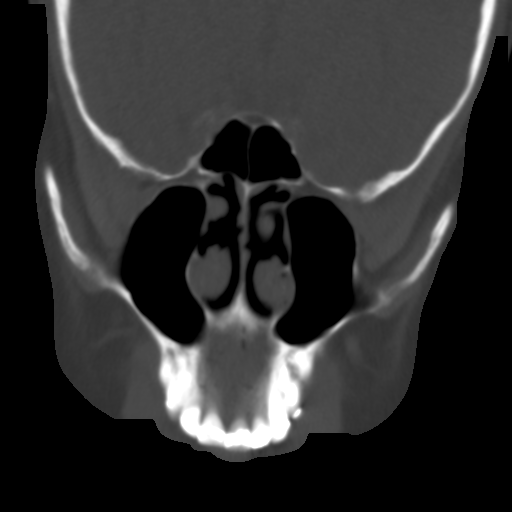
[im 7/15  bone]
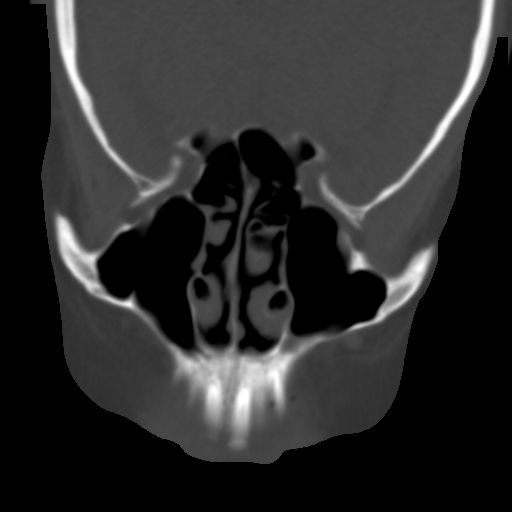
[im 8/15  bone]
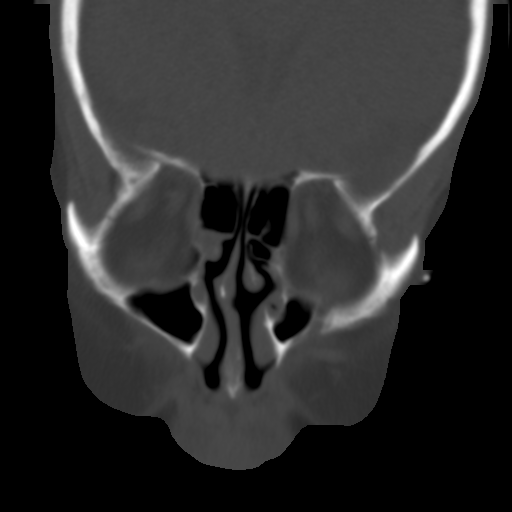
[im 9/15  bone]
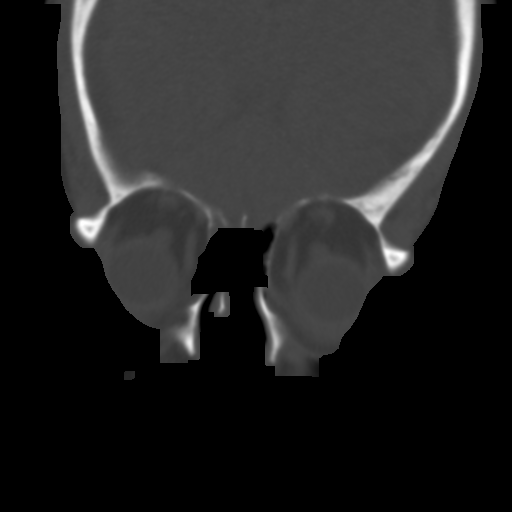
[im 10/15  brain]
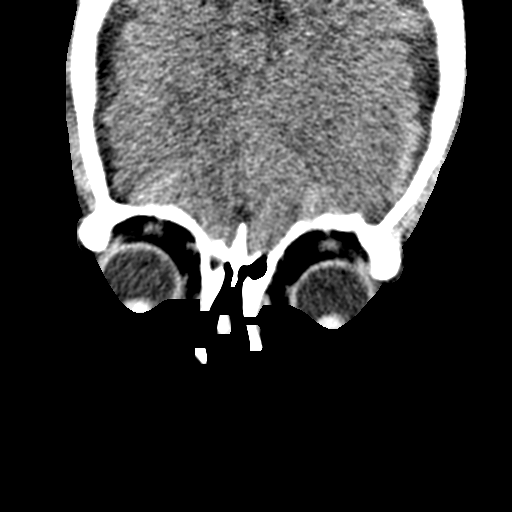
[im 10/15  bone]
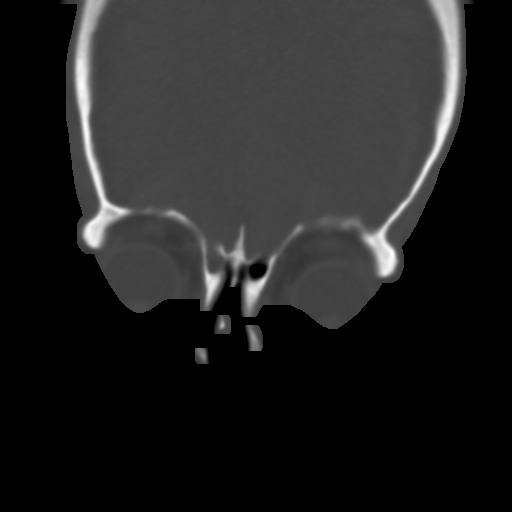
[im 11/15  bone]
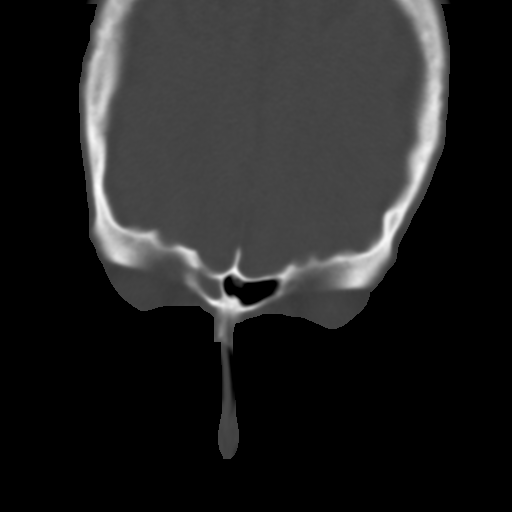
[im 12/15  bone]
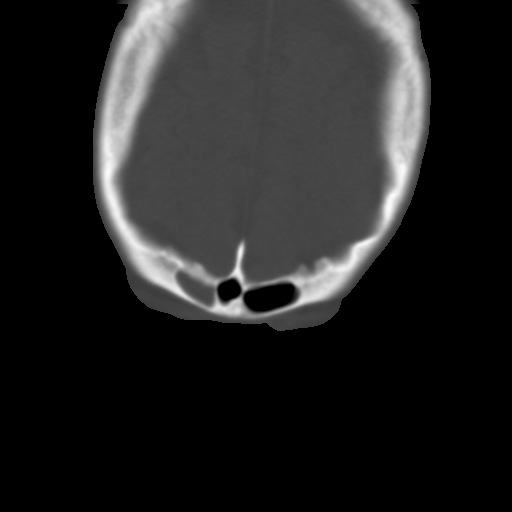
[im 13/15  bone]
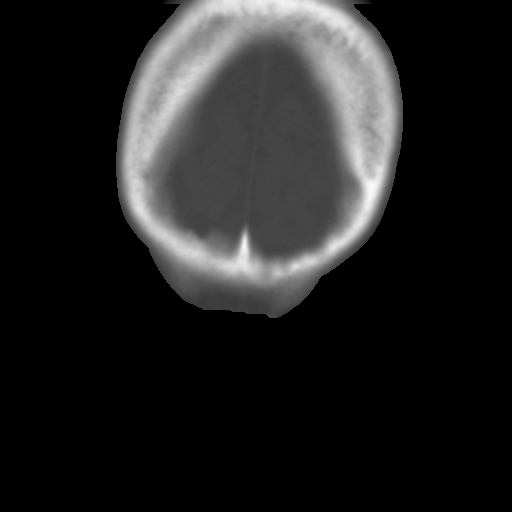
[im 14/15  brain]
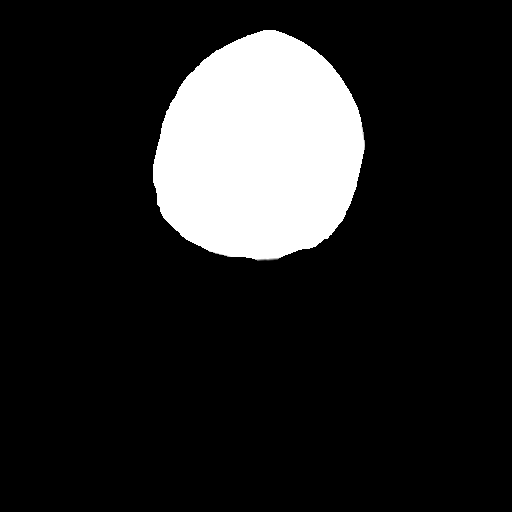
[im 14/15  bone]
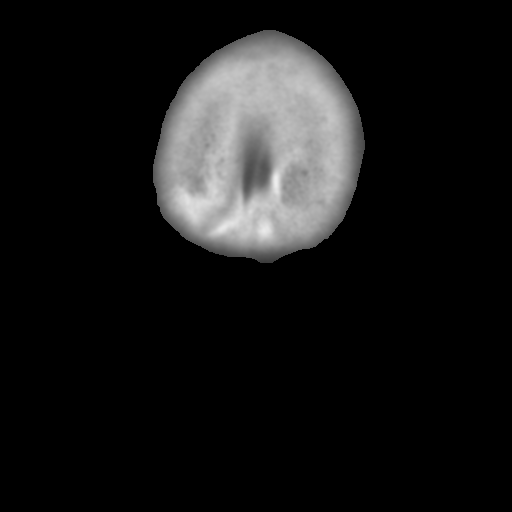

[13 of 15 positions shown; findings below may reference images not displayed]

FINDINGS: Visible noncontrast brain parenchyma, orbit and face soft tissues
appear grossly negative. Vertebral or basilar artery calcified
atherosclerosis evident on series 3, image 15.

Opacified right frontal sinus and right frontoethmoidal recess.

But minimal sinus mucosal thickening elsewhere. Nasal cavity appears
relatively well pneumatized with mild nasal septal deviation.

Visible tympanic cavities and mastoids are symmetrically aerated.

No acute osseous abnormality identified.
IMPRESSION: 1. Positive for frontoethmoidal pattern obstructive sinus disease on
the Right.
2. But otherwise negative paranasal sinuses. Mild nasal septal
deviation.
